# Patient Record
Sex: Male | Born: 1960 | Race: Black or African American | Hispanic: No | Marital: Married | Smoking: Light tobacco smoker
Health system: Southern US, Community
[De-identification: ages and names within clinical notes are randomized; demographics above are authoritative.]

## PROBLEM LIST (undated history)

## (undated) DIAGNOSIS — C61 Malignant neoplasm of prostate: Secondary | ICD-10-CM

## (undated) HISTORY — PX: PROSTATE BIOPSY: SHX241

---

## 2010-01-02 ENCOUNTER — Encounter: Admission: RE | Admit: 2010-01-02 | Discharge: 2010-01-02 | Payer: Self-pay | Admitting: Family Medicine

## 2010-07-22 ENCOUNTER — Encounter: Payer: Self-pay | Admitting: Family Medicine

## 2012-06-16 ENCOUNTER — Encounter: Payer: Self-pay | Admitting: Internal Medicine

## 2012-07-28 ENCOUNTER — Encounter: Payer: Self-pay | Admitting: Internal Medicine

## 2013-04-22 ENCOUNTER — Ambulatory Visit (INDEPENDENT_AMBULATORY_CARE_PROVIDER_SITE_OTHER): Payer: BC Managed Care – PPO

## 2013-04-22 ENCOUNTER — Ambulatory Visit: Payer: Self-pay | Admitting: Family Medicine

## 2013-04-22 ENCOUNTER — Encounter (INDEPENDENT_AMBULATORY_CARE_PROVIDER_SITE_OTHER): Payer: Self-pay

## 2013-04-22 ENCOUNTER — Ambulatory Visit (INDEPENDENT_AMBULATORY_CARE_PROVIDER_SITE_OTHER): Payer: BC Managed Care – PPO | Admitting: Family Medicine

## 2013-04-22 DIAGNOSIS — M25569 Pain in unspecified knee: Secondary | ICD-10-CM

## 2013-04-22 DIAGNOSIS — M25562 Pain in left knee: Secondary | ICD-10-CM

## 2013-04-22 MED ORDER — TRAMADOL-ACETAMINOPHEN 37.5-325 MG PO TABS: 1.0000 | ORAL_TABLET | Freq: Four times a day (QID) | ORAL | Status: DC | PRN

## 2013-04-22 NOTE — Progress Notes (Signed)
  Subjective:    Patient ID: Thomas Watson, male    DOB: 1961-03-30, 52 y.o.   MRN: [id number]  HPI Patient is in stable chief complaint knee pain status post fall. Patient was working in his  Yard last week when he actually fell landing on his left knee. Patient states he said persistent left knee pain since this point. No knee swelling, locking or giving away. S. Eppard on the anterior knee pain with some medial knee pain as well. No prior history of knee arthritis in the trauma in the past. Has been able to bear weight. Has not been using anything for pain.    Review of Systems  All other systems reviewed and are negative.       Objective:   Physical Exam  Constitutional: He appears well-developed and well-nourished.  HENT:  Head: Normocephalic and atraumatic.  Eyes: Conjunctivae are normal. Pupils are equal, round, and reactive to light.  Neck: Normal range of motion.  Cardiovascular: Normal rate and regular rhythm.   Pulmonary/Chest: Effort normal and breath sounds normal.  Abdominal: Soft.  Musculoskeletal:  No visible deformities on exam. Mild anterior knee pain with resisted knee extension. Positive painful patella compression. McMurray's negative  Neurological: He is alert.   [facility] reading (PRIMARY) by  Dr. Alvester Morin  Left knee x-ray preliminary negative for any fracture or dislocation.                                         Assessment & Plan:  Left knee pain - Plan: DG Knee 1-2 Views Left, traMADol-acetaminophen (ULTRACET) 37.5-325 MG per tablet  Suspect traumatic knee sprain.  RICE and ultracet (avoid NSIADs given prior GI history).  Discussed general care and MSK red flags.  Follow up with sports medicine if sxs not improved.

## 2013-05-06 ENCOUNTER — Encounter: Payer: Self-pay | Admitting: *Deleted

## 2014-05-27 IMAGING — CR DG KNEE 1-2V*L*
2 series · 2 of 2 positions shown · non-contrast
Comparison: None.

CLINICAL DATA: Left knee pain.

EXAM:
LEFT KNEE - 1-2 VIEW

[view not recorded (1 of 2)]
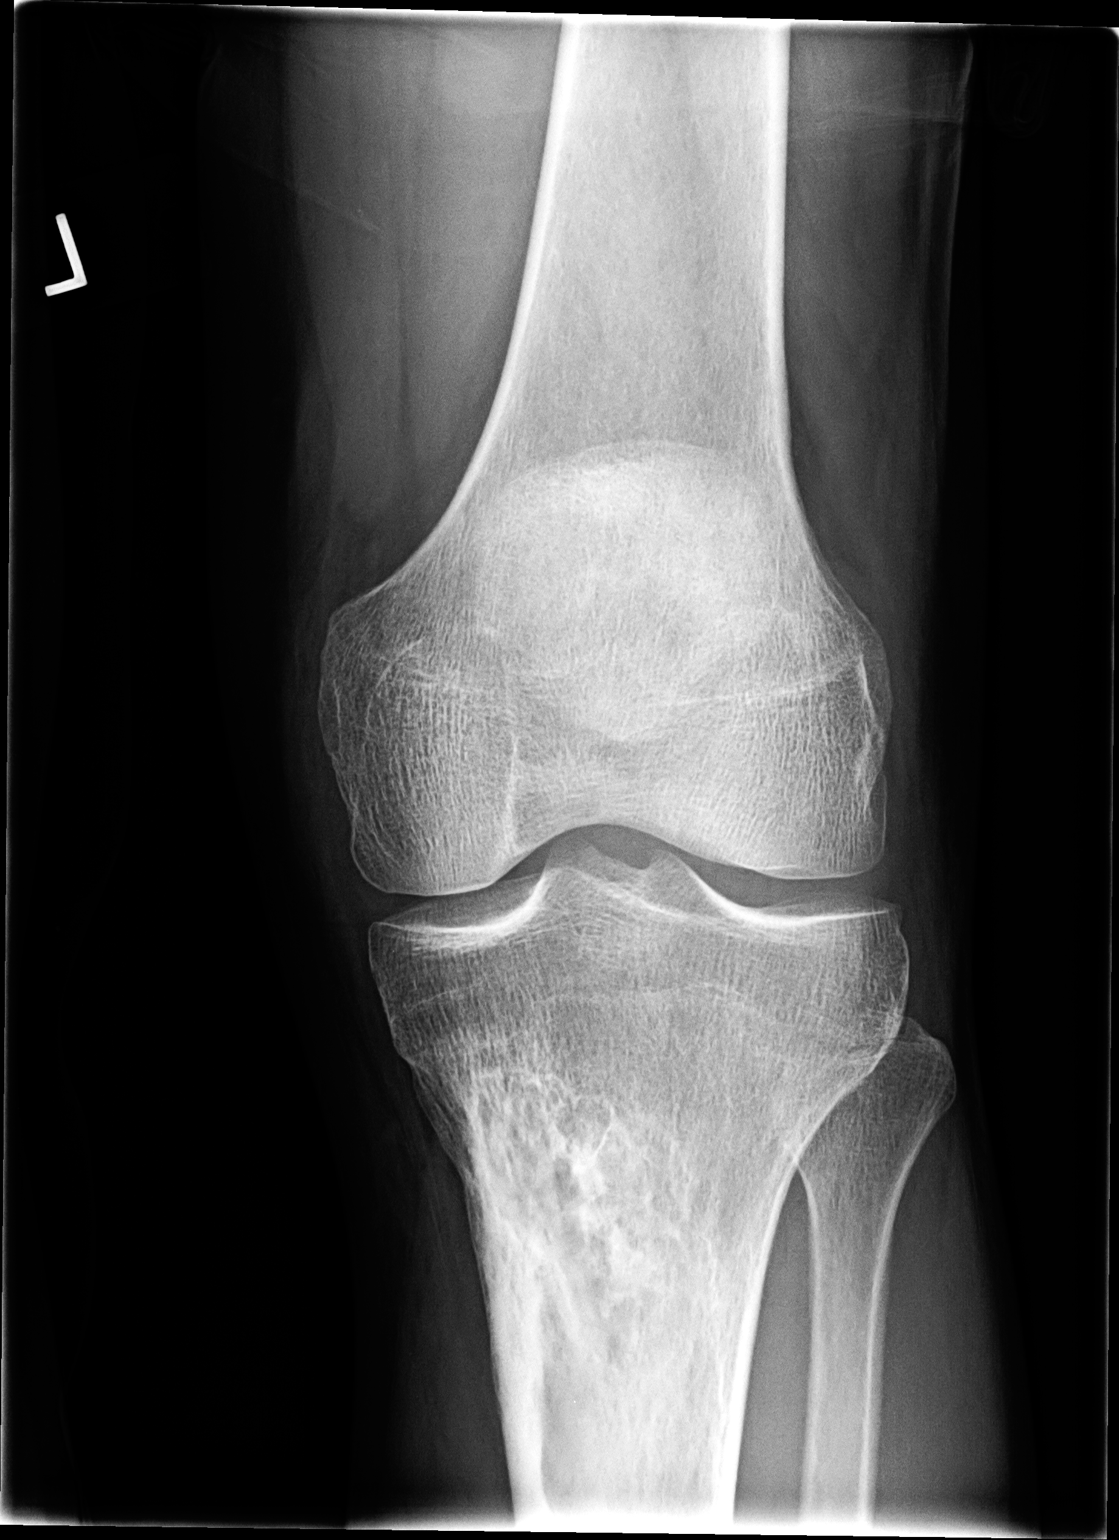

[view not recorded (2 of 2)]
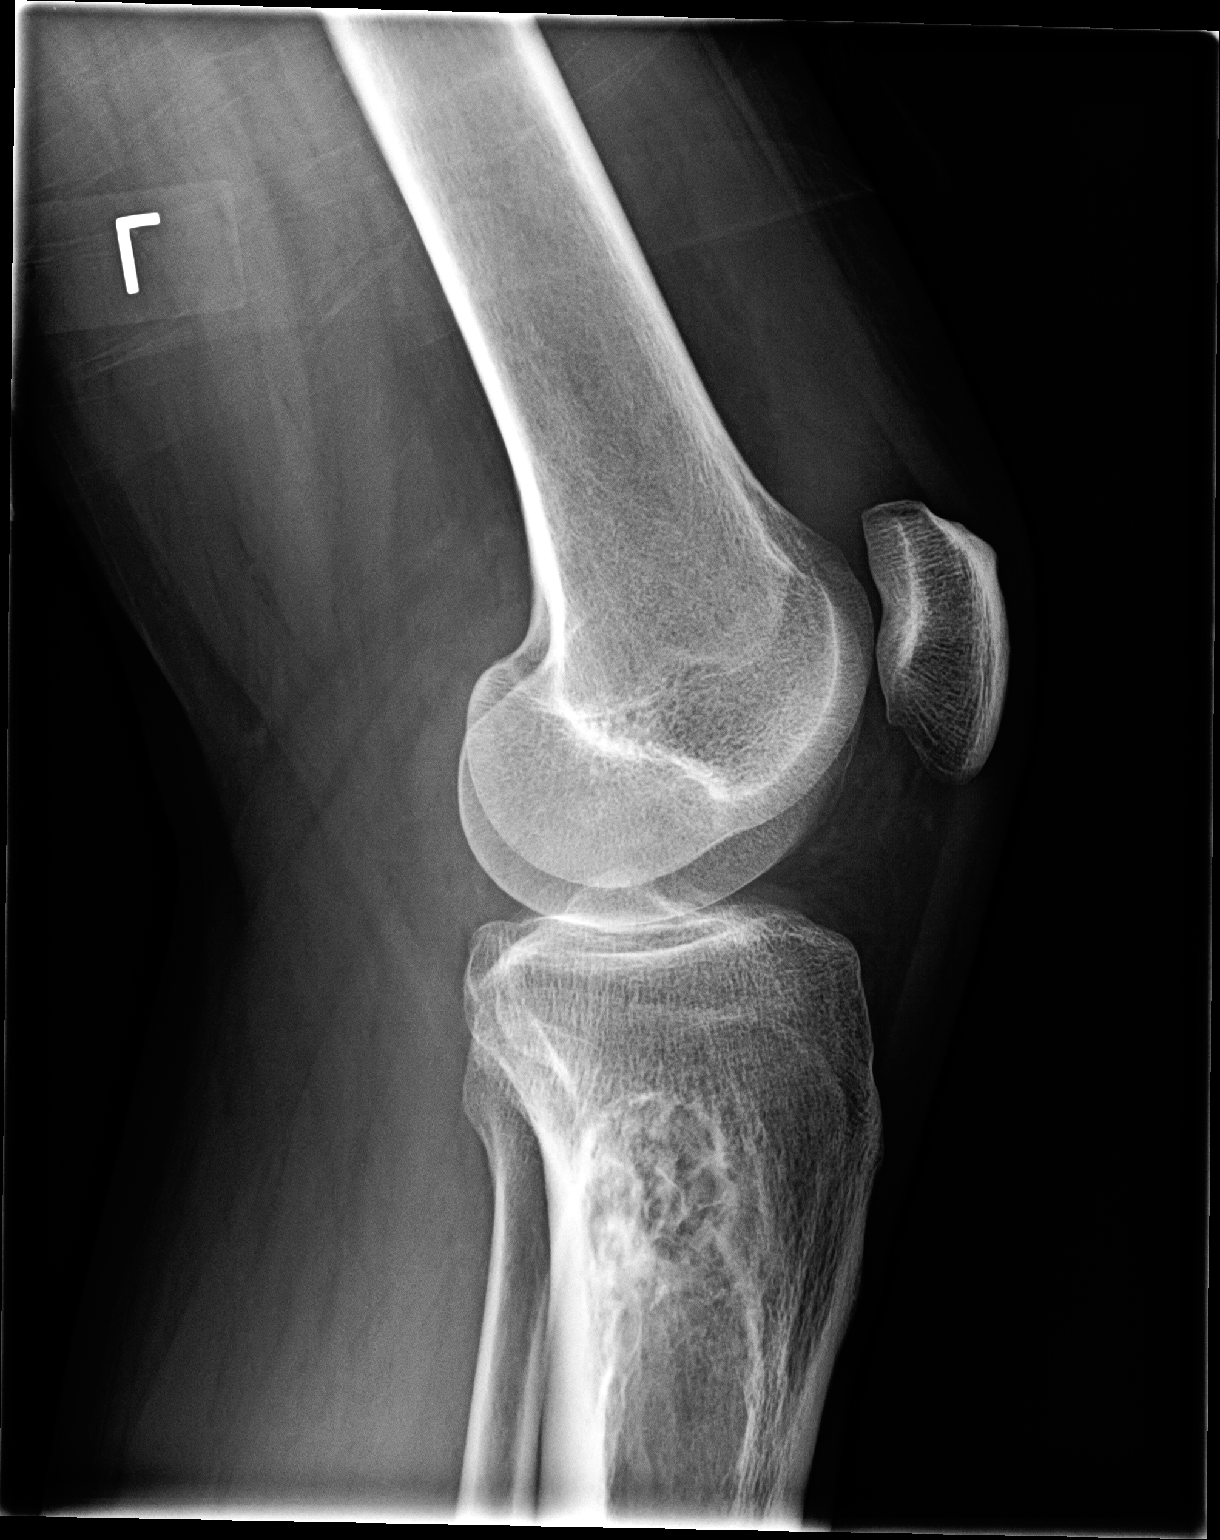

[2 of 2 positions shown; findings below may reference images not displayed]

FINDINGS: The joint spaces are maintained. No acute fracture or osteochondral
lesion. There is a moderate-sized joint effusion. There is a bone
lesion involving the proximal tibia with cortical thickening and
benign-appearing plain films features. Fibrous dysplasia and Paget's
disease are considerations. No worrisome plain film findings.
IMPRESSION: No acute bony findings.

Moderate-sized joint effusion.

Benign-appearing/nonaggressive bone lesion in the metadiaphyseal
region of the tibia. This is likely a benign process such as fibrous
dysplasia or Paget's disease. Follow up tib/fib x-rays in 4-6 months
recommended to document stability.a

## 2015-08-11 ENCOUNTER — Ambulatory Visit (INDEPENDENT_AMBULATORY_CARE_PROVIDER_SITE_OTHER): Payer: Managed Care, Other (non HMO) | Admitting: Family Medicine

## 2015-08-11 ENCOUNTER — Encounter: Payer: Self-pay | Admitting: Family Medicine

## 2015-08-11 VITALS — BP 140/78 | HR 74 | Temp 97.0°F | Ht 68.0 in | Wt 139.4 lb

## 2015-08-11 DIAGNOSIS — Z23 Encounter for immunization: Secondary | ICD-10-CM | POA: Diagnosis not present

## 2015-08-11 DIAGNOSIS — Z0000 Encounter for general adult medical examination without abnormal findings: Secondary | ICD-10-CM

## 2015-08-11 DIAGNOSIS — Z720 Tobacco use: Secondary | ICD-10-CM

## 2015-08-11 DIAGNOSIS — N400 Enlarged prostate without lower urinary tract symptoms: Secondary | ICD-10-CM

## 2015-08-11 DIAGNOSIS — N529 Male erectile dysfunction, unspecified: Secondary | ICD-10-CM

## 2015-08-11 MED ORDER — SILDENAFIL CITRATE 20 MG PO TABS: ORAL_TABLET | ORAL | Status: DC

## 2015-08-11 NOTE — Progress Notes (Signed)
   HPI  Patient presents today here for an annual physical.  Patient denies any complaints, has no concerns. 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  He denies any chest pain, dyspnea, palpitations, leg edema.  He is approximately one half pack per day smoker for many years. He's not really considering quitting.  He does not check his blood pressure, no history of hypertension. He does have a history of BPH, he tried Flomax but then had problems with ejaculation so he  changed to saw palmetto  He denies any urinary complaints currently.  His wife goes to [facility] gastroenterology, he would like to be referred there for colonoscopy.  He also mentions difficulty maintaining erection, he would like to try sildenafil  PMH: Smoking status noted ROS: Per HPI  Objective: BP 140/78 mmHg  Pulse 74  Temp(Src) 97 F (36.1 C) (Oral)  Ht _0  (1.727 m)  Wt 139 lb 6.4 oz (63.231 kg)  BMI 21.20 kg/m2 Gen: NAD, alert, cooperative with exam HEENT: NCAT, TMs normal bilaterally, nares clear, oropharynx clear CV: RRR, good S1/S2, no murmur Resp: CTABL, no wheezes, non-labored Abd: SNTND, BS present, no guarding or organomegaly Ext: No edema, warm Neuro: Alert and oriented, strength 5/5 and sensation intact in bilateral upper and lower extremities  Assessment and plan:  # Annual physical exam Normal exam Labs, practically fasting, he has had a boiled egg today.  # BPH Continue saw palmetto, PSA  # Tobacco abuse Counseled, is not really ready  # Healthcare maintenance Hep C Refer to GI for colonoscopy Labs Flu shot  Erectile dysf Trial of revatio  Borderline blood pressure-discussed usual range, he will monitor a local drugstore and return if persistently elevated over 140    Orders Placed This Encounter  Procedures  . CMP14+EGFR   . CBC  . Lipid panel  . PSA  . TSH  . Hepatitis C Antibody  . Ambulatory referral to Gastroenterology    Referral Priority:  Routine    Referral Type:  Consultation    Referral Reason:  Specialty Services Required    Number of Visits Requested:  1    Meds ordered this encounter  Medications  . sildenafil (REVATIO) 20 MG tablet    Sig: 2-5 pills once daily as needed for erectile dysfunction    Dispense:  30 tablet    Refill:  [place], MD [facility] 08/11/2015, 11:42 AM

## 2015-08-11 NOTE — Patient Instructions (Signed)
Great to meet you!  We will call within 1 week with your labs  You will have to by sildinafil without insurance coverage  You will get a call arranging the appt with [facility].   Lets see you back next year about this time.

## 2015-08-12 LAB — CMP14+EGFR
ALBUMIN: 5.1 g/dL (ref 3.5–5.5)
ALK PHOS: 73 IU/L (ref 39–117)
ALT: 40 IU/L (ref 0–44)
AST: 33 IU/L (ref 0–40)
Albumin/Globulin Ratio: 1.8 (ref 1.1–2.5)
BILIRUBIN TOTAL: 0.4 mg/dL (ref 0.0–1.2)
BUN / CREAT RATIO: 19 (ref 9–20)
BUN: 19 mg/dL (ref 6–24)
CHLORIDE: 102 mmol/L (ref 96–106)
CO2: 21 mmol/L (ref 18–29)
CREATININE: 0.98 mg/dL (ref 0.76–1.27)
Calcium: 10.5 mg/dL — ABNORMAL HIGH (ref 8.7–10.2)
GFR calc Af Amer: 101 mL/min/{1.73_m2} (ref >59)
GFR calc non Af Amer: 87 mL/min/{1.73_m2} (ref >59)
GLUCOSE: 91 mg/dL (ref 65–99)
Globulin, Total: 2.8 g/dL (ref 1.5–4.5)
Potassium: 5.4 mmol/L — ABNORMAL HIGH (ref 3.5–5.2)
SODIUM: 148 mmol/L — AB (ref 134–144)
Total Protein: 7.9 g/dL (ref 6.0–8.5)

## 2015-08-12 LAB — CBC
HEMATOCRIT: 46.1 % (ref 37.5–51.0)
Hemoglobin: 15.1 g/dL (ref 12.6–17.7)
MCH: 29.9 pg (ref 26.6–33.0)
MCHC: 32.8 g/dL (ref 31.5–35.7)
MCV: 91 fL (ref 79–97)
PLATELETS: 313 10*3/uL (ref 150–379)
RBC: 5.05 x10E6/uL (ref 4.14–5.80)
RDW: 14.9 % (ref 12.3–15.4)
WBC: 6.3 10*3/uL (ref 3.4–10.8)

## 2015-08-12 LAB — LIPID PANEL
Chol/HDL Ratio: 2.4 ratio units (ref 0.0–5.0)
Cholesterol, Total: 208 mg/dL — ABNORMAL HIGH (ref 100–199)
HDL: 85 mg/dL (ref >39)
LDL Calculated: 104 mg/dL — ABNORMAL HIGH (ref 0–99)
TRIGLYCERIDES: 96 mg/dL (ref 0–149)
VLDL CHOLESTEROL CAL: 19 mg/dL (ref 5–40)

## 2015-08-12 LAB — HEPATITIS C ANTIBODY: Hep C Virus Ab: <0.1 s/co ratio (ref 0.0–0.9)

## 2015-08-12 LAB — TSH: TSH: 0.53 u[IU]/mL (ref 0.450–4.500)

## 2015-08-12 LAB — PSA: Prostate Specific Ag, Serum: 3.2 ng/mL (ref 0.0–4.0)

## 2015-08-14 ENCOUNTER — Telehealth: Payer: Self-pay | Admitting: Family Medicine

## 2015-08-25 ENCOUNTER — Other Ambulatory Visit (INDEPENDENT_AMBULATORY_CARE_PROVIDER_SITE_OTHER): Payer: Managed Care, Other (non HMO)

## 2015-08-25 DIAGNOSIS — R799 Abnormal finding of blood chemistry, unspecified: Secondary | ICD-10-CM

## 2015-08-25 NOTE — Progress Notes (Signed)
Lab only 

## 2015-08-26 LAB — CMP14+EGFR
A/G RATIO: 2 (ref 1.1–2.5)
ALK PHOS: 61 IU/L (ref 39–117)
ALT: 33 IU/L (ref 0–44)
AST: 29 IU/L (ref 0–40)
Albumin: 4.6 g/dL (ref 3.5–5.5)
BUN/Creatinine Ratio: 18 (ref 9–20)
BUN: 17 mg/dL (ref 6–24)
Bilirubin Total: 0.3 mg/dL (ref 0.0–1.2)
CO2: 22 mmol/L (ref 18–29)
Calcium: 10.1 mg/dL (ref 8.7–10.2)
Chloride: 101 mmol/L (ref 96–106)
Creatinine, Ser: 0.92 mg/dL (ref 0.76–1.27)
GFR calc Af Amer: 109 mL/min/{1.73_m2} (ref >59)
GFR calc non Af Amer: 94 mL/min/{1.73_m2} (ref >59)
GLOBULIN, TOTAL: 2.3 g/dL (ref 1.5–4.5)
Glucose: 98 mg/dL (ref 65–99)
POTASSIUM: 4.9 mmol/L (ref 3.5–5.2)
SODIUM: 142 mmol/L (ref 134–144)
Total Protein: 6.9 g/dL (ref 6.0–8.5)

## 2017-01-03 ENCOUNTER — Ambulatory Visit: Payer: Managed Care, Other (non HMO) | Admitting: Family Medicine

## 2017-01-21 DIAGNOSIS — Z86010 Personal history of colonic polyps: Secondary | ICD-10-CM | POA: Insufficient documentation

## 2017-01-22 ENCOUNTER — Encounter: Payer: Self-pay | Admitting: Family Medicine

## 2017-01-22 DIAGNOSIS — D126 Benign neoplasm of colon, unspecified: Secondary | ICD-10-CM | POA: Insufficient documentation

## 2017-11-11 ENCOUNTER — Encounter: Payer: Managed Care, Other (non HMO) | Admitting: Family Medicine

## 2018-02-17 ENCOUNTER — Encounter: Payer: Self-pay | Admitting: Family Medicine

## 2018-02-17 ENCOUNTER — Ambulatory Visit (INDEPENDENT_AMBULATORY_CARE_PROVIDER_SITE_OTHER): Payer: 59 | Admitting: Family Medicine

## 2018-02-17 VITALS — BP 114/75 | HR 85 | Temp 98.1°F | Ht 68.0 in | Wt 146.5 lb

## 2018-02-17 DIAGNOSIS — Z0000 Encounter for general adult medical examination without abnormal findings: Secondary | ICD-10-CM | POA: Diagnosis not present

## 2018-02-17 DIAGNOSIS — Z122 Encounter for screening for malignant neoplasm of respiratory organs: Secondary | ICD-10-CM

## 2018-02-17 MED ORDER — SILDENAFIL CITRATE 20 MG PO TABS
ORAL_TABLET | ORAL | 12 refills | Status: DC
Start: 2018-02-17 — End: 2019-04-29

## 2018-02-17 MED ORDER — SILDENAFIL CITRATE 20 MG PO TABS: ORAL_TABLET | ORAL | 12 refills | Status: DC

## 2018-02-17 NOTE — Patient Instructions (Signed)
See [facility] in [place].    Stop Smoking

## 2018-02-17 NOTE — Progress Notes (Signed)
Subjective:  Patient ID: Thomas Watson, male    DOB: 10-15-1960  Age: 57 y.o. MRN: [id number]  CC: Annual Exam (pt here today for CPE)   HPI Thomas Watson presents for Complete Physical  Depression screen [facility] 2/9 02/17/2018 08/11/2015  Decreased Interest 0 0  Down, Depressed, Hopeless 0 0  PHQ - 2 Score 0 0    History Thomas Watson has no past medical history on file.   He has no past surgical history on file.   His family history is not on file.He reports that he has been smoking cigarettes. He has been smoking about 0.25 packs per day. He has never used smokeless tobacco. He reports that he drinks alcohol. He reports that he does not use drugs.    ROS Review of Systems  Constitutional: Negative.   HENT: Negative.   Eyes: Negative for visual disturbance.  Respiratory: Negative for cough and shortness of breath.   Cardiovascular: Negative for chest pain and leg swelling.  Gastrointestinal: Negative for abdominal pain, diarrhea, nausea and vomiting.  Genitourinary: Negative for difficulty urinating.  Musculoskeletal: Negative for arthralgias and myalgias.  Skin: Negative for rash.  Neurological: Negative for headaches.  Psychiatric/Behavioral: Negative for sleep disturbance.    Objective:  BP 114/75   Pulse 85   Temp 98.1 F (36.7 C) (Oral)   Ht 5' 8" (1.727 m)   Wt 146 lb 8 oz (66.5 kg)   BMI 22.28 kg/m   BP Readings from Last 3 Encounters:  02/17/18 114/75  08/11/15 140/78    Wt Readings from Last 3 Encounters:  02/17/18 146 lb 8 oz (66.5 kg)  08/11/15 139 lb 6.4 oz (63.2 kg)     Physical Exam  Constitutional: He is oriented to person, place, and time. He appears well-developed and well-nourished.  HENT:  Head: Normocephalic and atraumatic.  Mouth/Throat: Oropharynx is clear and moist.  Eyes: Pupils are equal, round, and reactive to light. EOM are normal.  Neck: Normal range of motion. No tracheal deviation present. No thyromegaly present.    Cardiovascular: Normal rate, regular rhythm and normal heart sounds. Exam reveals no gallop and no friction rub.  No murmur heard. Pulmonary/Chest: Breath sounds normal. He has no wheezes. He has no rales.  Abdominal: Soft. Bowel sounds are normal. He exhibits no distension and no mass. There is no tenderness. Hernia confirmed negative in the right inguinal area and confirmed negative in the left inguinal area.  Genitourinary: Testes normal and penis normal.  Musculoskeletal: Normal range of motion. He exhibits no edema.  Lymphadenopathy:    He has no cervical adenopathy.  Neurological: He is alert and oriented to person, place, and time.  Skin: Skin is warm and dry.  Psychiatric: He has a normal mood and affect.      Assessment & Plan:   Thomas Watson was seen today for annual exam.  Diagnoses and all orders for this visit:  Well adult exam -     CBC with Differential/Platelet -     CMP14+EGFR -     Lipid panel -     Urinalysis -     PSA Total (Reflex To Free) -     VITAMIN D 25 Hydroxy (Vit-D Deficiency, Fractures)  Encounter for screening for malignant neoplasm of respiratory organs -     CT CHEST LUNG CA SCREEN LOW DOSE W/O CM; Future  Other orders -     Discontinue: sildenafil (REVATIO) 20 MG tablet; 2-5 pills once daily as needed for erectile dysfunction -  sildenafil (REVATIO) 20 MG tablet; 2-5 pills once daily as needed for erectile dysfunction       I am having Thomas Watson maintain his sildenafil.  Allergies as of 02/17/2018   No Known Allergies     Medication List        Accurate as of 02/17/18 10:55 PM. Always use your most recent med list.          sildenafil 20 MG tablet Commonly known as:  REVATIO 2-5 pills once daily as needed for erectile dysfunction        Follow-up: Return in about 1 year (around 02/18/2019).  Warren Stacks, M.D. 

## 2018-02-18 ENCOUNTER — Other Ambulatory Visit: Payer: Self-pay

## 2018-02-18 DIAGNOSIS — R972 Elevated prostate specific antigen [PSA]: Secondary | ICD-10-CM

## 2018-02-18 LAB — CBC WITH DIFFERENTIAL/PLATELET
BASOS ABS: 0 10*3/uL (ref 0.0–0.2)
BASOS: 0 %
EOS (ABSOLUTE): 0.2 10*3/uL (ref 0.0–0.4)
EOS: 3 %
HEMATOCRIT: 44.7 % (ref 37.5–51.0)
HEMOGLOBIN: 14.7 g/dL (ref 13.0–17.7)
IMMATURE GRANS (ABS): 0 10*3/uL (ref 0.0–0.1)
Immature Granulocytes: 0 %
Lymphocytes Absolute: 2.6 10*3/uL (ref 0.7–3.1)
Lymphs: 34 %
MCH: 29.3 pg (ref 26.6–33.0)
MCHC: 32.9 g/dL (ref 31.5–35.7)
MCV: 89 fL (ref 79–97)
MONOCYTES: 7 %
Monocytes Absolute: 0.5 10*3/uL (ref 0.1–0.9)
NEUTROS ABS: 4.2 10*3/uL (ref 1.4–7.0)
Neutrophils: 56 %
Platelets: 309 10*3/uL (ref 150–450)
RBC: 5.01 x10E6/uL (ref 4.14–5.80)
RDW: 14.1 % (ref 12.3–15.4)
WBC: 7.5 10*3/uL (ref 3.4–10.8)

## 2018-02-18 LAB — VITAMIN D 25 HYDROXY (VIT D DEFICIENCY, FRACTURES): Vit D, 25-Hydroxy: 61.4 ng/mL (ref 30.0–100.0)

## 2018-02-18 LAB — LIPID PANEL
Chol/HDL Ratio: 2.9 ratio (ref 0.0–5.0)
Cholesterol, Total: 187 mg/dL (ref 100–199)
HDL: 64 mg/dL (ref >39)
LDL CALC: 104 mg/dL — AB (ref 0–99)
Triglycerides: 94 mg/dL (ref 0–149)
VLDL Cholesterol Cal: 19 mg/dL (ref 5–40)

## 2018-02-18 LAB — CMP14+EGFR
ALBUMIN: 4.7 g/dL (ref 3.5–5.5)
ALT: 20 IU/L (ref 0–44)
AST: 20 IU/L (ref 0–40)
Albumin/Globulin Ratio: 2 (ref 1.2–2.2)
Alkaline Phosphatase: 75 IU/L (ref 39–117)
BILIRUBIN TOTAL: 0.2 mg/dL (ref 0.0–1.2)
BUN / CREAT RATIO: 21 — AB (ref 9–20)
BUN: 19 mg/dL (ref 6–24)
CHLORIDE: 101 mmol/L (ref 96–106)
CO2: 21 mmol/L (ref 20–29)
Calcium: 10.2 mg/dL (ref 8.7–10.2)
Creatinine, Ser: 0.92 mg/dL (ref 0.76–1.27)
GFR calc non Af Amer: 92 mL/min/{1.73_m2} (ref >59)
GFR, EST AFRICAN AMERICAN: 106 mL/min/{1.73_m2} (ref >59)
GLUCOSE: 83 mg/dL (ref 65–99)
Globulin, Total: 2.4 g/dL (ref 1.5–4.5)
Potassium: 4.8 mmol/L (ref 3.5–5.2)
Sodium: 141 mmol/L (ref 134–144)
TOTAL PROTEIN: 7.1 g/dL (ref 6.0–8.5)

## 2018-02-18 LAB — FPSA% REFLEX
% FREE PSA: 10.7 %
PSA, FREE: 0.49 ng/mL

## 2018-02-18 LAB — PSA TOTAL (REFLEX TO FREE): PROSTATE SPECIFIC AG, SERUM: 4.6 ng/mL — AB (ref 0.0–4.0)

## 2018-02-19 ENCOUNTER — Telehealth: Payer: Self-pay | Admitting: Family Medicine

## 2018-02-19 DIAGNOSIS — L729 Follicular cyst of the skin and subcutaneous tissue, unspecified: Secondary | ICD-10-CM

## 2018-02-19 NOTE — Telephone Encounter (Signed)
Please refer to plastic surgery

## 2018-03-03 NOTE — Telephone Encounter (Signed)
No call back- referral has been placed.

## 2018-03-12 ENCOUNTER — Ambulatory Visit (HOSPITAL_COMMUNITY): Payer: Self-pay

## 2018-11-13 ENCOUNTER — Telehealth: Payer: Self-pay | Admitting: Family Medicine

## 2019-02-12 ENCOUNTER — Telehealth: Payer: Self-pay | Admitting: Family Medicine

## 2019-02-12 NOTE — Telephone Encounter (Signed)
appt rescheduled.  Patient aware

## 2019-02-19 ENCOUNTER — Encounter: Payer: 59 | Admitting: Family Medicine

## 2019-03-16 ENCOUNTER — Other Ambulatory Visit: Payer: Self-pay

## 2019-03-17 ENCOUNTER — Encounter: Payer: 59 | Admitting: Family Medicine

## 2019-03-30 ENCOUNTER — Encounter: Payer: 59 | Admitting: Family Medicine

## 2019-04-29 ENCOUNTER — Encounter: Payer: Self-pay | Admitting: Family Medicine

## 2019-04-29 ENCOUNTER — Ambulatory Visit (INDEPENDENT_AMBULATORY_CARE_PROVIDER_SITE_OTHER): Payer: 59 | Admitting: Family Medicine

## 2019-04-29 ENCOUNTER — Other Ambulatory Visit: Payer: Self-pay

## 2019-04-29 VITALS — BP 140/93 | HR 86 | Temp 97.3°F | Ht 68.0 in | Wt 145.2 lb

## 2019-04-29 DIAGNOSIS — D1721 Benign lipomatous neoplasm of skin and subcutaneous tissue of right arm: Secondary | ICD-10-CM

## 2019-04-29 DIAGNOSIS — Z720 Tobacco use: Secondary | ICD-10-CM | POA: Diagnosis not present

## 2019-04-29 DIAGNOSIS — Z0001 Encounter for general adult medical examination with abnormal findings: Secondary | ICD-10-CM

## 2019-04-29 DIAGNOSIS — Z23 Encounter for immunization: Secondary | ICD-10-CM

## 2019-04-29 DIAGNOSIS — Z0000 Encounter for general adult medical examination without abnormal findings: Secondary | ICD-10-CM

## 2019-04-29 MED ORDER — SILDENAFIL CITRATE 20 MG PO TABS: ORAL_TABLET | ORAL | 12 refills | Status: DC

## 2019-04-29 NOTE — Progress Notes (Signed)
Subjective:  Patient ID: Thomas Watson, male    DOB: 06-Aug-1960  Age: 58 y.o. MRN: [id number]  CC: Annual Exam   HPI Kshawn Canal presents for Annual physical. No current symptoms or concerns. Smoker with about 7.5 pack-year hx.  Depression screen [facility] 2/9 04/29/2019 02/17/2018 08/11/2015  Decreased Interest 0 0 0  Down, Depressed, Hopeless 0 0 0  PHQ - 2 Score 0 0 0    History Keymari has no past medical history on file.   He has no past surgical history on file.   His family history is not on file.He reports that he has been smoking cigarettes. He has been smoking about 0.25 packs per day. He has never used smokeless tobacco. He reports current alcohol use. He reports that he does not use drugs.    ROS Review of Systems  Constitutional: Negative for activity change, fatigue and unexpected weight change.  HENT: Negative for congestion, ear pain, hearing loss, postnasal drip and trouble swallowing.   Eyes: Negative for pain and visual disturbance.  Respiratory: Negative for cough, chest tightness and shortness of breath.   Cardiovascular: Negative for chest pain, palpitations and leg swelling.  Gastrointestinal: Negative for abdominal distention, abdominal pain, blood in stool, constipation, diarrhea, nausea and vomiting.  Endocrine: Negative for cold intolerance, heat intolerance and polydipsia.  Genitourinary: Negative for difficulty urinating, dysuria, flank pain, frequency and urgency.  Musculoskeletal: Negative for arthralgias and joint swelling.  Skin: Negative for color change, rash and wound.  Neurological: Negative for dizziness, syncope, speech difficulty, weakness, light-headedness, numbness and headaches.  Hematological: Does not bruise/bleed easily.  Psychiatric/Behavioral: Negative for confusion, decreased concentration, dysphoric mood and sleep disturbance. The patient is not nervous/anxious.     Objective:  BP (!) 140/93   Pulse 86   Temp (!) 97.3 F  (36.3 C) (Temporal)   Ht 5' 8"  (1.727 m)   Wt 145 lb 3.2 oz (65.9 kg)   SpO2 99%   BMI 22.08 kg/m   BP Readings from Last 3 Encounters:  04/29/19 (!) 140/93  02/17/18 114/75  08/11/15 140/78    Wt Readings from Last 3 Encounters:  04/29/19 145 lb 3.2 oz (65.9 kg)  02/17/18 146 lb 8 oz (66.5 kg)  08/11/15 139 lb 6.4 oz (63.2 kg)     Physical Exam Constitutional:      Appearance: He is well-developed.  HENT:     Head: Normocephalic and atraumatic.  Eyes:     Pupils: Pupils are equal, round, and reactive to light.  Neck:     Musculoskeletal: Normal range of motion.     Thyroid: No thyromegaly.     Trachea: No tracheal deviation.  Cardiovascular:     Rate and Rhythm: Normal rate and regular rhythm.     Heart sounds: Normal heart sounds. No murmur. No friction rub. No gallop.   Pulmonary:     Breath sounds: Normal breath sounds. No wheezing or rales.  Abdominal:     General: Bowel sounds are normal. There is no distension.     Palpations: Abdomen is soft. There is no mass.     Tenderness: There is no abdominal tenderness.     Hernia: There is no hernia in the left inguinal area.  Genitourinary:    Penis: Normal.      Scrotum/Testes: Normal.  Musculoskeletal: Normal range of motion.  Lymphadenopathy:     Cervical: No cervical adenopathy.  Skin:    General: Skin is warm and dry.  Neurological:  Mental Status: He is alert and oriented to person, place, and time.       Assessment & Plan:   Efe was seen today for annual exam.  Diagnoses and all orders for this visit:  Well adult exam -     CBC with Differential/Platelet -     CMP14+EGFR -     PSA Total (Reflex To Free) -     Lipid panel -     Urinalysis -     VITAMIN D 25 Hydroxy (Vit-D Deficiency, Fractures)  Tobacco abuse -     CBC with Differential/Platelet -     CMP14+EGFR -     PSA Total (Reflex To Free) -     Lipid panel -     Urinalysis -     VITAMIN D 25 Hydroxy (Vit-D Deficiency,  Fractures)  Lipoma of right shoulder -     Ambulatory referral to Plastic Surgery  Need for immunization against influenza -     Flu Vaccine QUAD 36+ mos IM  Other orders -     sildenafil (REVATIO) 20 MG tablet; 2-5 pills once daily as needed for erectile dysfunction    Smoking cessation strategies reviewed. Consider Chantix.    I am having Divine Hansley maintain his sildenafil.  Allergies as of 04/29/2019   No Known Allergies     Medication List       Accurate as of April 29, 2019  3:02 PM. If you have any questions, ask your nurse or doctor.        sildenafil 20 MG tablet Commonly known as: Revatio 2-5 pills once daily as needed for erectile dysfunction        Follow-up: Return in about 1 year (around 04/28/2020).  [name], M.D.

## 2019-04-30 LAB — CBC WITH DIFFERENTIAL/PLATELET
Basophils Absolute: 0.1 10*3/uL (ref 0.0–0.2)
Basos: 1 %
EOS (ABSOLUTE): 0.2 10*3/uL (ref 0.0–0.4)
Eos: 3 %
Hematocrit: 49.5 % (ref 37.5–51.0)
Hemoglobin: 15.8 g/dL (ref 13.0–17.7)
Immature Grans (Abs): 0 10*3/uL (ref 0.0–0.1)
Immature Granulocytes: 0 %
Lymphocytes Absolute: 2.6 10*3/uL (ref 0.7–3.1)
Lymphs: 41 %
MCH: 29.8 pg (ref 26.6–33.0)
MCHC: 31.9 g/dL (ref 31.5–35.7)
MCV: 93 fL (ref 79–97)
Monocytes Absolute: 0.6 10*3/uL (ref 0.1–0.9)
Monocytes: 9 %
Neutrophils Absolute: 3 10*3/uL (ref 1.4–7.0)
Neutrophils: 46 %
Platelets: 304 10*3/uL (ref 150–450)
RBC: 5.3 x10E6/uL (ref 4.14–5.80)
RDW: 13.5 % (ref 11.6–15.4)
WBC: 6.4 10*3/uL (ref 3.4–10.8)

## 2019-04-30 LAB — FPSA% REFLEX
% FREE PSA: 8.6 %
PSA, FREE: 0.49 ng/mL

## 2019-04-30 LAB — CMP14+EGFR
ALT: 28 IU/L (ref 0–44)
AST: 24 IU/L (ref 0–40)
Albumin/Globulin Ratio: 2.2 (ref 1.2–2.2)
Albumin: 5 g/dL — ABNORMAL HIGH (ref 3.8–4.9)
Alkaline Phosphatase: 71 IU/L (ref 39–117)
BUN/Creatinine Ratio: 23 — ABNORMAL HIGH (ref 9–20)
BUN: 19 mg/dL (ref 6–24)
Bilirubin Total: 0.4 mg/dL (ref 0.0–1.2)
CO2: 25 mmol/L (ref 20–29)
Calcium: 9.9 mg/dL (ref 8.7–10.2)
Chloride: 102 mmol/L (ref 96–106)
Creatinine, Ser: 0.83 mg/dL (ref 0.76–1.27)
GFR calc Af Amer: 112 mL/min/{1.73_m2} (ref >59)
GFR calc non Af Amer: 97 mL/min/{1.73_m2} (ref >59)
Globulin, Total: 2.3 g/dL (ref 1.5–4.5)
Glucose: 83 mg/dL (ref 65–99)
Potassium: 4.6 mmol/L (ref 3.5–5.2)
Sodium: 139 mmol/L (ref 134–144)
Total Protein: 7.3 g/dL (ref 6.0–8.5)

## 2019-04-30 LAB — LIPID PANEL
Chol/HDL Ratio: 2.4 ratio (ref 0.0–5.0)
Cholesterol, Total: 186 mg/dL (ref 100–199)
HDL: 76 mg/dL (ref >39)
LDL Chol Calc (NIH): 94 mg/dL (ref 0–99)
Triglycerides: 87 mg/dL (ref 0–149)
VLDL Cholesterol Cal: 16 mg/dL (ref 5–40)

## 2019-04-30 LAB — PSA TOTAL (REFLEX TO FREE): Prostate Specific Ag, Serum: 5.7 ng/mL — ABNORMAL HIGH (ref 0.0–4.0)

## 2019-04-30 LAB — VITAMIN D 25 HYDROXY (VIT D DEFICIENCY, FRACTURES): Vit D, 25-Hydroxy: 49.8 ng/mL (ref 30.0–100.0)

## 2019-05-03 ENCOUNTER — Other Ambulatory Visit: Payer: Self-pay | Admitting: Family Medicine

## 2019-05-03 DIAGNOSIS — R972 Elevated prostate specific antigen [PSA]: Secondary | ICD-10-CM

## 2019-07-09 ENCOUNTER — Institutional Professional Consult (permissible substitution): Payer: Self-pay | Admitting: Plastic Surgery

## 2019-08-27 ENCOUNTER — Institutional Professional Consult (permissible substitution): Payer: Self-pay | Admitting: Plastic Surgery

## 2019-09-16 ENCOUNTER — Encounter: Payer: Self-pay | Admitting: Radiation Oncology

## 2019-09-16 NOTE — Progress Notes (Signed)
GU Location of Tumor / Histology: prostatic adenocarcinoma  If Prostate Cancer, Gleason Score is (4 + 3) and PSA is (6.11)in January 2021. Prostate volume: 27.39 grams. 7 of 12 cores involved.  Thomas Watson was referred to Dr. Karsten Ro by his PCP, Dr. [name], for further evaluation of an elevated PSA of 5.7 in October 04/29/19.   Biopsies of prostate (if applicable) revealed:    Past/Anticipated interventions by urology, if any: prostate biopsy, CT scan, not interested in surgery, referred to Dr. Tammi Klippel to weight external beam vs seeding  Past/Anticipated interventions by medical oncology, if any: no  Weight changes, if any: denies  Bowel/Bladder complaints, if any: IPSS 6. SHIM 25. Denies urinary leakage or incontinence. Denies dysuria or hematuria. Denies any bowel complaints. Denies erectile dysfunction but denies being active recently.    Nausea/Vomiting, if any: denies  Pain issues, if any:  denies  SAFETY ISSUES:  Prior radiation? denies  Pacemaker/ICD? denies  Possible current pregnancy? no, male patient  Is the patient on methotrexate? no  Current Complaints / other details:  59 years old. Married to Thomas Watson. Three children from a prior relationship. Works full time. States, "I feel fine if Dr. Livia Snellen hadn't told me something was wrong I would have never known."

## 2019-09-17 ENCOUNTER — Ambulatory Visit
Admission: RE | Admit: 2019-09-17 | Discharge: 2019-09-17 | Disposition: A | Discharge status: Home / Self Care | Payer: 59 | Source: Ambulatory Visit | Attending: Radiation Oncology | Admitting: Radiation Oncology

## 2019-09-17 ENCOUNTER — Other Ambulatory Visit: Payer: Self-pay

## 2019-09-17 ENCOUNTER — Encounter: Payer: Self-pay | Admitting: Urology

## 2019-09-17 ENCOUNTER — Encounter: Payer: Self-pay | Admitting: Radiation Oncology

## 2019-09-17 VITALS — Ht 68.0 in | Wt 145.0 lb

## 2019-09-17 DIAGNOSIS — C61 Malignant neoplasm of prostate: Secondary | ICD-10-CM

## 2019-09-17 HISTORY — DX: Malignant neoplasm of prostate: C61

## 2019-09-17 NOTE — Progress Notes (Signed)
See progress note under physician encounter. 

## 2019-09-17 NOTE — Progress Notes (Signed)
Radiation Oncology         [phone] ________________________________  Initial Outpatient Consultation - Conducted via Telephone due to current COVID-19 concerns for limiting patient exposure  Name: Thomas Watson MRN: [id number]  Date: 09/17/2019  DOB: [birth year]  OB:[id number], Cletus Gash, MD  [name], MD   REFERRING PHYSICIAN: [name], MD  DIAGNOSIS: 59 y.o. gentleman with Stage T1c adenocarcinoma of the prostate with Gleason score of 4+3, and PSA of 6.11.    ICD-10-CM   1. Malignant neoplasm of prostate [facility])  C61     HISTORY OF PRESENT ILLNESS: Thomas Watson is a 59 y.o. male with a diagnosis of prostate cancer. He was noted to have a borderline elevated PSA in 2019, per patient report, and this was found to be further elevated at 5.7 on 04/29/19 by his primary care physician, Dr. Livia Snellen.  Accordingly, he was referred for evaluation in urology by Dr. Karsten Ro on 07/06/2019,  digital rectal examination was performed at that time revealing slight right-sided firmness with no worrisome nodularity.  Repeat PSA performed at that time was 6.11.  Therefore, the patient proceeded to transrectal ultrasound with 12 biopsies of the prostate on 08/10/2019.  The prostate volume measured 27.39 cc.  Out of 12 core biopsies, 7 were positive.  The maximum Gleason score was 4+3, and this was seen in the right base lateral, right apex (with PNI), and right base (with PNI).  Additionally, Gleason 3+4 was seen in the right apex lateral (two small foci) and right mid lateral with a small foci of Gleason 3+3 in the right mid and left apex lateral.  He was scheduled to undergo CT abdomen/pelvis, but his insurance denied this.  The patient reviewed the biopsy results with his urologist and he has kindly been referred today for discussion of potential radiation treatment options.   PREVIOUS RADIATION THERAPY: No  PAST MEDICAL HISTORY:  Past Medical History:  Diagnosis Date  . Prostate cancer ([place])        PAST SURGICAL HISTORY: Past Surgical History:  Procedure Laterality Date  . PROSTATE BIOPSY      FAMILY HISTORY:  Family History  Problem Relation Age of Onset  . Colon cancer Paternal Uncle   . Bladder Cancer Paternal Grandmother   . Breast cancer Neg Hx   . Pancreatic cancer Neg Hx     SOCIAL HISTORY:  Social History   Socioeconomic History  . Marital status: Married    Spouse name: Caren Griffins   . Number of children: 3  . Years of education: Not on file  . Highest education level: Not on file  Occupational History    Comment: full time  Tobacco Use  . Smoking status: Light Tobacco Smoker    Packs/day: 0.25    Years: 30.00    Pack years: 7.50    Types: Cigarettes  . Smokeless tobacco: Never Used  Substance and Sexual Activity  . Alcohol use: Yes  . Drug use: No  . Sexual activity: Not Currently  Other Topics Concern  . Not on file  Social History Narrative   Patient and current wife don't have any children together. Patient has three children from a prior relationship.   Social Determinants of Health   Financial Resource Strain:   . Difficulty of Paying Living Expenses:   Food Insecurity:   . Worried About Charity fundraiser in the Last Year:   . Arboriculturist in the Last Year:   Transportation Needs:   . Lack  of Transportation (Medical):   Marland Kitchen Lack of Transportation (Non-Medical):   Physical Activity:   . Days of Exercise per Week:   . Minutes of Exercise per Session:   Stress:   . Feeling of Stress :   Social Connections:   . Frequency of Communication with Friends and Family:   . Frequency of Social Gatherings with Friends and Family:   . Attends Religious Services:   . Active Member of Clubs or Organizations:   . Attends Archivist Meetings:   Marland Kitchen Marital Status:   Intimate Partner Violence:   . Fear of Current or Ex-Partner:   . Emotionally Abused:   Marland Kitchen Physically Abused:   . Sexually Abused:     ALLERGIES:  Ibuprofen  MEDICATIONS:  Current Outpatient Medications  Medication Sig Dispense Refill  . cholecalciferol (VITAMIN D3) 25 MCG (1000 UNIT) tablet Take 1,000 Units by mouth daily.    . Multiple Vitamin (MULTIVITAMIN) tablet Take 1 tablet by mouth daily.    . Saw Palmetto 1000 MG CAPS Take by mouth.    . sildenafil (REVATIO) 20 MG tablet 2-5 pills once daily as needed for erectile dysfunction (Patient not taking: Reported on 09/17/2019) 30 tablet 12   No current facility-administered medications for this encounter.    REVIEW OF SYSTEMS:  On review of systems, the patient reports that he is doing well overall. He denies any chest pain, shortness of breath, cough, fevers, chills, night sweats, unintended weight changes. He denies any bowel disturbances, and denies abdominal pain, nausea or vomiting. He denies any new musculoskeletal or joint aches or pains. His IPSS was 6, indicating mild urinary symptoms. His SHIM was 25, indicating he does not have erectile dysfunction. A complete review of systems is obtained and is otherwise negative.    PHYSICAL EXAM:  Wt Readings from Last 3 Encounters:  09/17/19 145 lb (65.8 kg)  04/29/19 145 lb 3.2 oz (65.9 kg)  02/17/18 146 lb 8 oz (66.5 kg)   Temp Readings from Last 3 Encounters:  04/29/19 (!) 97.3 F (36.3 C) (Temporal)  02/17/18 98.1 F (36.7 C) (Oral)  08/11/15 97 F (36.1 C) (Oral)   BP Readings from Last 3 Encounters:  04/29/19 (!) 140/93  02/17/18 114/75  08/11/15 140/78   Pulse Readings from Last 3 Encounters:  04/29/19 86  02/17/18 85  08/11/15 74   Pain Assessment Pain Score: 0-No pain/10  Physical exam not performed in light of telephone consult visit format.   KPS = 100  100 - Normal; no complaints; no evidence of disease. 90   - Able to carry on normal activity; minor signs or symptoms of disease. 80   - Normal activity with effort; some signs or symptoms of disease. 57   - Cares for self; unable to carry on normal  activity or to do active work. 60   - Requires occasional assistance, but is able to care for most of his personal needs. 50   - Requires considerable assistance and frequent medical care. 43   - Disabled; requires special care and assistance. 43   - Severely disabled; hospital admission is indicated although death not imminent. 66   - Very sick; hospital admission necessary; active supportive treatment necessary. 10   - Moribund; fatal processes progressing rapidly. 0     - Dead  Karnofsky DA, Abelmann WH, Craver LS and Burchenal [facility] [phone]) The use of the nitrogen mustards in the palliative treatment of carcinoma: with particular reference to bronchogenic carcinoma Cancer  1 [id number]  LABORATORY DATA:  Lab Results  Component Value Date   WBC 6.4 04/29/2019   HGB 15.8 04/29/2019   HCT 49.5 04/29/2019   MCV 93 04/29/2019   PLT 304 04/29/2019   Lab Results  Component Value Date   NA 139 04/29/2019   K 4.6 04/29/2019   CL 102 04/29/2019   CO2 25 04/29/2019   Lab Results  Component Value Date   ALT 28 04/29/2019   AST 24 04/29/2019   ALKPHOS 71 04/29/2019   BILITOT 0.4 04/29/2019     RADIOGRAPHY: No results found.    IMPRESSION/PLAN: This visit was conducted via Telephone to spare the patient unnecessary potential exposure in the healthcare setting during the current COVID-19 pandemic. 1. 59 y.o. gentleman with Stage T1c adenocarcinoma of the prostate with Gleason Score of 4+3, and PSA of 6.11. We discussed the patient's workup and outlined the nature of prostate cancer in this setting. The patient's T stage, Gleason's score, and PSA put him into the unfavorable intermediate risk group. Accordingly, he is eligible for a variety of potential treatment options including brachytherapy, 5.5 weeks of external radiation, or prostatectomy. We discussed the available radiation techniques, and focused on the details and logistics of delivery. We discussed and outlined the risks, benefits, short  and long-term effects associated with radiotherapy and compared and contrasted these with prostatectomy. We discussed the role of SpaceOAR in reducing the rectal toxicity associated with radiotherapy.  At the end of the conversation, the patient is interested in moving forward with brachytherapy and use of SpaceOAR to reduce rectal toxicity from radiotherapy.  We will share our discussion with Dr. Karsten Ro and move forward with scheduling his CT [facility] planning appointment in the near future.  The patient will be contacted by Romie Jumper in our office who will be working closely with him to coordinate OR scheduling and pre and post procedure appointments.  We will contact the pharmaceutical rep to ensure that [place] is available at the time of procedure.  He will have a prostate MRI following his post-seed CT SIM to confirm appropriate distribution of the [place].   Given current concerns for patient exposure during the COVID-19 pandemic, this encounter was conducted via telephone. The patient was notified in advance and was offered a MyChart meeting to allow for face to face communication but unfortunately reported that he did not have the appropriate resources/technology to support such a visit and instead preferred to proceed with telephone consult. The patient has given verbal consent for this type of encounter. The time spent during this encounter was 50 minutes. The attendants for this meeting include Tyler Pita MD, Ashlyn Bruning PA-C, Bruce, and patient Thomas Watson. During the encounter, Tyler Pita MD, Ashlyn Bruning PA-C, and scribe, Wilburn Mylar were located at [facility].  Patient Thomas Watson was located at home.    Nicholos Johns, PA-C    Tyler Pita, MD  [facility] Direct Dial: [phone]  Fax: [phone] [email or web address]  Skype  [email or web address]  This document serves  as a record of services personally performed by Tyler Pita, MD and Freeman Caldron, PA-C. It was created on their behalf by Wilburn Mylar, a trained medical scribe. The creation of this record is based on the scribe's personal observations and the provider's statements to them. This document has been checked and approved by the attending provider.

## 2019-09-20 ENCOUNTER — Telehealth: Payer: Self-pay | Admitting: Medical Oncology

## 2019-09-20 ENCOUNTER — Ambulatory Visit: Payer: 59 | Attending: Internal Medicine

## 2019-09-20 DIAGNOSIS — Z23 Encounter for immunization: Secondary | ICD-10-CM

## 2019-09-20 NOTE — Telephone Encounter (Signed)
Spoke with patient to introduce myself as the prostate nurse navigator and discuss my role. I was unable to meet him 3/19, when he consulted with Dr. Tammi Klippel. He states the consult went well and he has chosen brachytherapy as treatment. He spoke with Enid Derry earlier today and is aware she will contact him with appointments. He had his first COVID vaccine today and will go for his second one 4/14. He asked if this will interfere with the procedure. I told him no. I gave him my contact information and asked him to call me with questions or concerns. He voiced understanding.

## 2019-09-20 NOTE — Progress Notes (Signed)
   Covid-19 Vaccination Clinic  Name:  Thomas Watson    MRN: [id number] DOB: 1960-12-17  09/20/2019  [name] was observed post Covid-19 immunization for 15 minutes without incident. He was provided with Vaccine Information Sheet and instruction to access the V-Safe system.   [name] was instructed to call 911 with any severe reactions post vaccine: Marland Kitchen Difficulty breathing  . Swelling of face and throat  . A fast heartbeat  . A bad rash all over body  . Dizziness and weakness   Immunizations Administered    Name Date Dose VIS Date Route   Pfizer COVID-19 Vaccine 09/20/2019 10:45 AM 0.3 mL 06/11/2019 Intramuscular   Manufacturer: [place]   Lot: [id number]   [place]: [id number]

## 2019-09-27 ENCOUNTER — Telehealth: Payer: Self-pay | Admitting: Medical Oncology

## 2019-09-27 ENCOUNTER — Encounter: Payer: Self-pay | Admitting: Radiation Oncology

## 2019-09-27 NOTE — Telephone Encounter (Signed)
Patient called asking if the appointments scheduled for 4/15 could be moved to 4/16. He states he has taken a lot of time off from work since being diagnosed with cancer. He is off on Friday and if he can get these rescheduled, he will not have to take more time off. I informed him, I will notify Enid Derry of his request and she will be in touch with him. He was very Patent attorney.

## 2019-09-28 ENCOUNTER — Telehealth: Payer: Self-pay | Admitting: Medical Oncology

## 2019-09-28 NOTE — Telephone Encounter (Signed)
Patient called stating he has not been called with appointment for CT. I explained the CT will done as planning for seed implant, and it will be done here at the cancer center on 4/15. I discussed what will take place during the CT simulation and where [employer] is located. He voiced understanding of the above and I asked him to call me if I can further assist him.

## 2019-10-08 ENCOUNTER — Ambulatory Visit: Payer: 59 | Attending: Internal Medicine

## 2019-10-08 DIAGNOSIS — Z23 Encounter for immunization: Secondary | ICD-10-CM

## 2019-10-08 NOTE — Progress Notes (Signed)
   Covid-19 Vaccination Clinic  Name:  Thomas Watson    MRN: [id number] DOB: 1961/04/29  10/08/2019  Mr. Thomas Watson was observed post Covid-19 immunization for 15 minutes without incident. He was provided with Vaccine Information Sheet and instruction to access the V-Safe system.   Mr. Thomas Watson was instructed to call 911 with any severe reactions post vaccine: Marland Kitchen Difficulty breathing  . Swelling of face and throat  . A fast heartbeat  . A bad rash all over body  . Dizziness and weakness   Immunizations Administered    Name Date Dose VIS Date Route   Pfizer COVID-19 Vaccine 10/08/2019 10:47 AM 0.3 mL 06/11/2019 Intramuscular   Manufacturer: [place]   Lot: [id number]   [place]: [id number]

## 2019-10-12 ENCOUNTER — Other Ambulatory Visit: Payer: Self-pay | Admitting: Urology

## 2019-10-12 ENCOUNTER — Telehealth: Payer: Self-pay | Admitting: *Deleted

## 2019-10-12 NOTE — Telephone Encounter (Signed)
Called patient to inform of implant date, spoke with patient and he is aware of this date. 

## 2019-10-13 ENCOUNTER — Ambulatory Visit: Payer: 59

## 2019-10-13 ENCOUNTER — Telehealth: Payer: Self-pay | Admitting: *Deleted

## 2019-10-13 NOTE — Telephone Encounter (Signed)
CALLED PATIENT TO REMIND OF PRE-SEED APPTS. FOR 10-14-19, SPOKE WITH PATIENT AND HE IS AWARE OF THESE APPTS.

## 2019-10-14 ENCOUNTER — Ambulatory Visit (HOSPITAL_COMMUNITY)
Admission: RE | Admit: 2019-10-14 | Discharge: 2019-10-14 | Disposition: A | Discharge status: Home / Self Care | Payer: 59 | Source: Ambulatory Visit | Attending: Urology | Admitting: Urology

## 2019-10-14 ENCOUNTER — Encounter (HOSPITAL_COMMUNITY)
Admission: RE | Admit: 2019-10-14 | Discharge: 2019-10-14 | Disposition: A | Discharge status: Home / Self Care | Payer: 59 | Source: Ambulatory Visit | Attending: Urology | Admitting: Urology

## 2019-10-14 ENCOUNTER — Other Ambulatory Visit: Payer: Self-pay

## 2019-10-14 ENCOUNTER — Encounter: Payer: Self-pay | Admitting: Medical Oncology

## 2019-10-14 ENCOUNTER — Ambulatory Visit
Admission: RE | Admit: 2019-10-14 | Discharge: 2019-10-14 | Disposition: A | Discharge status: Home / Self Care | Payer: 59 | Source: Ambulatory Visit | Attending: Urology | Admitting: Urology

## 2019-10-14 ENCOUNTER — Other Ambulatory Visit: Payer: Self-pay | Admitting: Urology

## 2019-10-14 ENCOUNTER — Ambulatory Visit
Admission: RE | Admit: 2019-10-14 | Discharge: 2019-10-14 | Disposition: A | Discharge status: Home / Self Care | Payer: 59 | Source: Ambulatory Visit | Attending: Radiation Oncology | Admitting: Radiation Oncology

## 2019-10-14 DIAGNOSIS — C61 Malignant neoplasm of prostate: Secondary | ICD-10-CM

## 2019-10-15 NOTE — Progress Notes (Signed)
  Radiation Oncology         [phone]) [phone] ________________________________  Name: Thomas Watson MRN: [id number]  Date: 10/14/2019  DOB: 06-03-1961  SIMULATION AND TREATMENT PLANNING NOTE PUBIC ARCH STUDY  OB:[id number], Cletus Gash, MD  [name], MD  DIAGNOSIS: 59 y.o. gentleman with Stage T1c adenocarcinoma of the prostate with Gleason score of 4+3, and PSA of 6.11    ICD-10-CM   1. Malignant neoplasm of prostate ([place])  C61     COMPLEX SIMULATION:  The patient presented today for evaluation for possible prostate seed implant. He was brought to the radiation planning suite and placed supine on the CT couch. A 3-dimensional image study set was obtained in upload to the planning computer. There, on each axial slice, I contoured the prostate gland. Then, using three-dimensional radiation planning tools I reconstructed the prostate in view of the structures from the transperineal needle pathway to assess for possible pubic arch interference. In doing so, I did not appreciate any pubic arch interference. Also, the patient's prostate volume was estimated based on the drawn structure. The volume was 26 cc.  Given the pubic arch appearance and prostate volume, patient remains a good candidate to proceed with prostate seed implant. Today, he freely provided informed written consent to proceed.    PLAN: The patient will undergo prostate seed implant.   ________________________________  Sheral Apley. Tammi Klippel, M.D.

## 2019-11-26 ENCOUNTER — Other Ambulatory Visit: Payer: Self-pay

## 2019-11-26 ENCOUNTER — Telehealth: Payer: Self-pay | Admitting: *Deleted

## 2019-11-26 ENCOUNTER — Encounter (HOSPITAL_BASED_OUTPATIENT_CLINIC_OR_DEPARTMENT_OTHER): Payer: Self-pay | Admitting: Urology

## 2019-11-26 NOTE — Progress Notes (Signed)
Spoke w/ via phone for pre-op interview---patient Lab needs dos---- none             Lab results------chest xray 10-14-2019 epic, ekg 10-14-2019 epic, has lab appt 12-03-2019@850  am for cbc, cmet, pt, ptt COVID test ------12-03-2019@945  am Arrive at -------1100 am 12-06-2019 No food after midnight, clear liquids from midnight until 700 am then npo Medications to take morning of surgery -----none Diabetic medication -----n/a Patient Special Instructions -----fleets enema am of surgery Pre-Op special Istructions -----none Patient verbalized understanding of instructions that were given at this phone interview. Patient denies shortness of breath, chest pain, fever, cough a this phone interview.

## 2019-11-26 NOTE — Telephone Encounter (Signed)
CALLED PATIENT TO REMIND OF LAB AND COVID TESTING FOR 12-03-19, SPOKE WITH PATIENT AND HE IS AWARE OF THESE APPTS. °

## 2019-11-29 NOTE — Discharge Instructions (Addendum)
Post Anesthesia Home Care Instructions  Activity: Get plenty of rest for the remainder of the day. A responsible adult should stay with you for 24 hours following the procedure.  For the next 24 hours, DO NOT: -Drive a car -Paediatric nurse -Drink alcoholic beverages -Take any medication unless instructed by your physician -Make any legal decisions or sign important papers.  Meals: Start with liquid foods such as gelatin or soup. Progress to regular foods as tolerated. Avoid greasy, spicy, heavy foods. If nausea and/or vomiting occur, drink only clear liquids until the nausea and/or vomiting subsides. Call your physician if vomiting continues.  Special Instructions/Symptoms: Your throat may feel dry or sore from the anesthesia or the breathing tube placed in your throat during surgery. If this causes discomfort, gargle with warm salt water. The discomfort should disappear within 24 hours.  If you had a scopolamine patch placed behind your ear for the management of post- operative nausea and/or vomiting:  1. The medication in the patch is effective for 72 hours, after which it should be removed.  Wrap patch in a tissue and discard in the trash. Wash hands thoroughly with soap and water. 2. You may remove the patch earlier than 72 hours if you experience unpleasant side effects which may include dry mouth, dizziness or visual disturbances. 3. Avoid touching the patch. Wash your hands with soap and water after contact with the patch.   DISCHARGE INSTRUCTIONS FOR PROSTATE SEED IMPLANTATION  Antibiotics You may be given a prescription for an antibiotic to take when you arrive home. If so, be sure to take every tablet in the bottle, even if you are feeling better before the prescription is finished. If you begin itching, notice a rash or start to swell on your trunk, arms, legs and/or throat, immediately stop taking the antibiotic and call your Urologist. Diet Resume your usual diet when you  return home. To keep your bowels moving easily and softly, drink prune, apple and cranberry juice at room temperature. You may also take a stool softener, such as Colace, which is available without prescription at local pharmacies. Daily activities ? No driving or heavy lifting for at least two days after the implant. ? No bike riding, horseback riding or riding lawn mowers for the first month after the implant. ? Any strenuous physical activity should be approved by your doctor before you resume it. Sexual relations You may resume sexual relations two weeks after the procedure. A condom should be used for the first two weeks. Your semen may be dark brown or black; this is normal and is related bleeding that may have occurred during the implant. Postoperative swelling Expect swelling and bruising of the scrotum and perineum (the area between the scrotum and anus). Both the swelling and the bruising should resolve in l or 2 weeks. Ice packs and over- the-counter medications such as Tylenol, Advil or Aleve may lessen your discomfort. Postoperative urination Most men experience burning on urination and/or urinary frequency. If this becomes bothersome, contact your Urologist.  Medication can be prescribed to relieve these problems.  It is normal to have some blood in your urine for a few days after the implant. Special instructions related to the seeds It is unlikely that you will pass an Iodine-125 seed in your urine. The seeds are silver in color and are about as large as a grain of rice. If you pass a seed, do not handle it with your fingers. Use a spoon to place it in an  envelope or jar in place this in base occluded area such as the garage or basement for return to the radiation clinic at your convenience.  Contact your doctor for ? Temperature greater than 101 F ? Increasing pain ? Inability to urinate Follow-up  You should have follow up with your urologist and radiation oncologist about 3  weeks after the procedure. General information regarding Iodine seeds ? Iodine-125 is a low energy radioactive material. It is not deeply penetrating and loses energy at short distances. Your prostate will absorb the radiation. Objects that are touched or used by the patient do not become radioactive. ? Body wastes (urine and stool) or body fluids (saliva, tears, semen or blood) are not radioactive. ? The Nuclear Regulatory Commission [facility]) has determined that no radiation precautions are needed for patients undergoing Iodine-125 seed implantation. The [facility] states that such patients do not present a risk to the people around them, including young children and pregnant women. However, in keeping with the general principle that radiation exposure should be kept as low reasonably possible, we suggest the following: ? Children and pets should not sit on the patient's lap for the first two (2) weeks after the implant. ? Pregnant (or possibly pregnant) women should avoid prolonged, close contact with the patient for the first two (2) weeks after the implant. ? A distance of three (3) feet is acceptable. ? At a distance of three (3) feet, there is no limit to the length of time anyone can be with the patient. ?

## 2019-11-29 NOTE — H&P (Signed)
CC/HPI: Prostate cancer:  PSA 07/07/19 - 6.11, Prostate was smooth and symmetric but seemed may be just slightly firmer on the right-hand side than the left.  TRUS/Bx 08/10/19: Prostate volume - 27 cc  Pathology: 3+3 in 2 cores, 3+4 in 2 cores and 4+3 in 3 cores (total 7/12 cores positive).  Stage: T1c (unfavorable, immediate risk)   Partin table results:  5/10 year progression-free probability with radical prostatectomy - 63%/47%  EC - 42%  CP - 54%  SV - 13%  LN - 12%   09/07/19: The patient has returned today to discuss management of his newly diagnosed prostate cancer. Due to the fact that he had unfavorable, intermediate risk prostate cancer with probability of lymph node involvement of > 10% according to NCCN guidelines I scheduled a CT scan however his insurance denied the study.     ALLERGIES: No Allergies    MEDICATIONS: Tamsulosin Hcl 0.4 mg capsule 1 capsule PO Q PM  Tamsulosin Hcl 0.4 mg capsule 1 capsule PO Q PM  Multivitamin  Saw Palmetto  Vitamin D3     GU PSH: Prostate Needle Biopsy - 08/10/2019     NON-GU PSH: Surgical Pathology, Gross And Microscopic Examination For Prostate Needle - 08/10/2019     GU PMH: Prostate Cancer, Because he had unfavorable intermediate prostate cancer with a greater than 10% probability of lymph node involvement as CT scan will be obtained and I will have him return to go over the results and discuss options for treatment. - 08/18/2019    NON-GU PMH: Acute gastric ulcer with hemorrhage GERD    FAMILY HISTORY: No Family History    SOCIAL HISTORY: Marital Status: Married Preferred Language: English; Ethnicity: Not Hispanic Or Latino; Race: Black or African American Current Smoking Status: Patient does not smoke anymore.   Tobacco Use Assessment Completed: Used Tobacco in last 30 days? Does drink.  Drinks 1 caffeinated drink per day.    REVIEW OF SYSTEMS:    GU Review Male:   Patient denies frequent urination, hard to postpone  urination, burning/ pain with urination, get up at night to urinate, leakage of urine, stream starts and stops, trouble starting your stream, have to strain to urinate , erection problems, and penile pain.  Gastrointestinal (Upper):   Patient denies nausea, vomiting, and indigestion/ heartburn.  Gastrointestinal (Lower):   Patient denies diarrhea and constipation.  Constitutional:   Patient denies weight loss, fever, fatigue, and night sweats.  Skin:   Patient denies skin rash/ lesion and itching.  Eyes:   Patient denies blurred vision and double vision.  Ears/ Nose/ Throat:   Patient denies sore throat and sinus problems.  Hematologic/Lymphatic:   Patient denies swollen glands and easy bruising.  Cardiovascular:   Patient denies leg swelling and chest pains.  Respiratory:   Patient denies cough and shortness of breath.  Endocrine:   Patient denies excessive thirst.  Musculoskeletal:   Patient denies back pain and joint pain.  Neurological:   Patient denies headaches and dizziness.  Psychologic:   Patient denies depression and anxiety.   VITAL SIGNS:    Weight 145 lb / 65.77 kg  Height 68 in / 172.72 cm  BP 116/72 mmHg  Pulse 94 /min  BMI 22.0 kg/m   GU PHYSICAL EXAMINATION:    Anus and Perineum: No hemorrhoids. No anal stenosis. No rectal fissure, no anal fissure. No edema, no dimple, no perineal tenderness, no anal tenderness.  Scrotum: No lesions. No edema. No cysts. No warts.  Epididymides: Right: no spermatocele, no masses, no cysts, no tenderness, no induration, no enlargement. Left: no spermatocele, no masses, no cysts, no tenderness, no induration, no enlargement.  Testes: No tenderness, no swelling, no enlargement left testes. No tenderness, no swelling, no enlargement right testes. Normal location left testes. Normal location right testes. No mass, no cyst, no varicocele, no hydrocele left testes. No mass, no cyst, no varicocele, no hydrocele right testes.  Urethral Meatus:  Normal size. No lesion, no wart, no discharge, no polyp. Normal location.  Penis: Circumcised, no warts, no cracks. No dorsal Peyronie's plaques, no left corporal Peyronie's plaques, no right corporal Peyronie's plaques, no scarring, no warts. No balanitis, no meatal stenosis.  Prostate: 40 gram or 2+ size. Prostate was smooth and symmetric but seemed may be just slightly firmer on the right-hand side than the left. No worrisome nodularity was noted.  Seminal Vesicles: Nonpalpable.  Sphincter Tone: Normal sphincter. No rectal tenderness. No rectal mass.    MULTI-SYSTEM PHYSICAL EXAMINATION:    Constitutional: Well-nourished. No physical deformities. Normally developed. Good grooming.  Neck: Neck symmetrical, not swollen. Normal tracheal position.  Respiratory: No labored breathing, no use of accessory muscles.   Cardiovascular: Normal temperature, normal extremity pulses, no swelling, no varicosities.  Lymphatic: No enlargement of neck, axillae, groin.  Skin: No paleness, no jaundice, no cyanosis. No lesion, no ulcer, no rash.  Neurologic / Psychiatric: Oriented to time, oriented to place, oriented to person. No depression, no anxiety, no agitation.  Gastrointestinal: No mass, no tenderness, no rigidity, non obese abdomen.  Eyes: Normal conjunctivae. Normal eyelids.  Ears, Nose, Mouth, and Throat: Left ear no scars, no lesions, no masses. Right ear no scars, no lesions, no masses. Nose no scars, no lesions, no masses. Normal hearing. Normal lips.  Musculoskeletal: Normal gait and station of head and neck.    PAST DATA REVIEWED:  Source Of History:  Patient   07/07/19 04/29/19  PSA  Total PSA 6.11 ng/mL 5.7 ng/dl  Free PSA 0.66 ng/mL   % Free PSA 11 % PSA 8.6 %    PROCEDURES:          Urinalysis Dipstick Dipstick Cont'd  Color: Yellow Bilirubin: Neg mg/dL  Appearance: Clear Ketones: Neg mg/dL  Specific Gravity: 1.025 Blood: Neg ery/uL  pH: 6.0 Protein: Neg mg/dL  Glucose: Neg  mg/dL Urobilinogen: 0.2 mg/dL    Nitrites: Neg    Leukocyte Esterase: Neg leu/uL    ASSESSMENT/PLAN:      ICD-10 Details  1 GU:   Prostate Cancer - C61 Undiagnosed New Problem              Notes:   We discussed proceeding with treatment. I again went over the options including radiation and surgery. He indicated that he did not want to consider surgery and therefore I discussed radiation with him including both external radiation and radioactive seed implantation.  He met with Dr. Tammi Klippel and discussed treatment with radiation and has elected to proceed with radioactive seed implant.

## 2019-12-03 ENCOUNTER — Other Ambulatory Visit: Payer: Self-pay

## 2019-12-03 ENCOUNTER — Other Ambulatory Visit (HOSPITAL_COMMUNITY)
Admission: RE | Admit: 2019-12-03 | Discharge: 2019-12-03 | Disposition: A | Discharge status: Home / Self Care | Payer: No Typology Code available for payment source | Source: Ambulatory Visit | Attending: Urology | Admitting: Urology

## 2019-12-03 ENCOUNTER — Encounter (HOSPITAL_COMMUNITY)
Admission: RE | Admit: 2019-12-03 | Discharge: 2019-12-03 | Disposition: A | Discharge status: Home / Self Care | Payer: No Typology Code available for payment source | Source: Ambulatory Visit | Attending: Urology | Admitting: Urology

## 2019-12-03 DIAGNOSIS — Z20822 Contact with and (suspected) exposure to covid-19: Secondary | ICD-10-CM | POA: Insufficient documentation

## 2019-12-03 DIAGNOSIS — Z01812 Encounter for preprocedural laboratory examination: Secondary | ICD-10-CM | POA: Insufficient documentation

## 2019-12-03 LAB — COMPREHENSIVE METABOLIC PANEL
ALT: 33 U/L (ref 0–44)
AST: 24 U/L (ref 15–41)
Albumin: 4.3 g/dL (ref 3.5–5.0)
Alkaline Phosphatase: 54 U/L (ref 38–126)
Anion gap: 8 (ref 5–15)
BUN: 17 mg/dL (ref 6–20)
CO2: 25 mmol/L (ref 22–32)
Calcium: 9.2 mg/dL (ref 8.9–10.3)
Chloride: 107 mmol/L (ref 98–111)
Creatinine, Ser: 0.94 mg/dL (ref 0.61–1.24)
GFR calc Af Amer: >60 mL/min (ref >60)
GFR calc non Af Amer: >60 mL/min (ref >60)
Glucose, Bld: 91 mg/dL (ref 70–99)
Potassium: 4.5 mmol/L (ref 3.5–5.1)
Sodium: 140 mmol/L (ref 135–145)
Total Bilirubin: 0.6 mg/dL (ref 0.3–1.2)
Total Protein: 7.1 g/dL (ref 6.5–8.1)

## 2019-12-03 LAB — CBC
HCT: 46.8 % (ref 39.0–52.0)
Hemoglobin: 15.1 g/dL (ref 13.0–17.0)
MCH: 29.7 pg (ref 26.0–34.0)
MCHC: 32.3 g/dL (ref 30.0–36.0)
MCV: 91.9 fL (ref 80.0–100.0)
Platelets: 319 10*3/uL (ref 150–400)
RBC: 5.09 MIL/uL (ref 4.22–5.81)
RDW: 14.8 % (ref 11.5–15.5)
WBC: 6.6 10*3/uL (ref 4.0–10.5)
nRBC: 0 % (ref 0.0–0.2)

## 2019-12-03 LAB — PROTIME-INR
INR: 1 (ref 0.8–1.2)
Prothrombin Time: 12.8 seconds (ref 11.4–15.2)

## 2019-12-03 LAB — APTT: aPTT: 35 seconds (ref 24–36)

## 2019-12-03 LAB — SARS CORONAVIRUS 2 (TAT 6-24 HRS): SARS Coronavirus 2: NEGATIVE

## 2019-12-06 ENCOUNTER — Encounter (HOSPITAL_BASED_OUTPATIENT_CLINIC_OR_DEPARTMENT_OTHER): Payer: Self-pay | Admitting: Urology

## 2019-12-06 ENCOUNTER — Other Ambulatory Visit: Payer: Self-pay

## 2019-12-06 ENCOUNTER — Ambulatory Visit (HOSPITAL_BASED_OUTPATIENT_CLINIC_OR_DEPARTMENT_OTHER): Payer: No Typology Code available for payment source | Admitting: Anesthesiology

## 2019-12-06 ENCOUNTER — Encounter (HOSPITAL_BASED_OUTPATIENT_CLINIC_OR_DEPARTMENT_OTHER)
Admission: RE | Disposition: A | Discharge status: Home / Self Care | Payer: Self-pay | Source: Ambulatory Visit | Attending: Urology

## 2019-12-06 ENCOUNTER — Ambulatory Visit (HOSPITAL_COMMUNITY): Payer: No Typology Code available for payment source

## 2019-12-06 ENCOUNTER — Ambulatory Visit (HOSPITAL_BASED_OUTPATIENT_CLINIC_OR_DEPARTMENT_OTHER)
Admission: RE | Admit: 2019-12-06 | Discharge: 2019-12-06 | Disposition: A | Discharge status: Home / Self Care | Payer: No Typology Code available for payment source | Source: Ambulatory Visit | Attending: Urology | Admitting: Urology

## 2019-12-06 DIAGNOSIS — C61 Malignant neoplasm of prostate: Secondary | ICD-10-CM | POA: Diagnosis present

## 2019-12-06 DIAGNOSIS — Z87891 Personal history of nicotine dependence: Secondary | ICD-10-CM | POA: Insufficient documentation

## 2019-12-06 HISTORY — PX: RADIOACTIVE SEED IMPLANT: SHX5150

## 2019-12-06 HISTORY — PX: SPACE OAR INSTILLATION: SHX6769

## 2019-12-06 SURGERY — INSERTION, RADIATION SOURCE, PROSTATE
Anesthesia: General

## 2019-12-06 MED ORDER — LIDOCAINE 2% (20 MG/ML) 5 ML SYRINGE
INTRAMUSCULAR | Status: DC | PRN
  Administered 2019-12-06: 60 mg via INTRAVENOUS

## 2019-12-06 MED ORDER — MIDAZOLAM HCL 2 MG/2ML IJ SOLN
INTRAMUSCULAR | Status: AC
  Filled 2019-12-06: qty 2

## 2019-12-06 MED ORDER — CIPROFLOXACIN IN D5W 400 MG/200ML IV SOLN
INTRAVENOUS | Status: AC
  Filled 2019-12-06: qty 200

## 2019-12-06 MED ORDER — SODIUM CHLORIDE 0.9 % IV SOLN
INTRAVENOUS | Status: AC | PRN
  Administered 2019-12-06: 1000 mL

## 2019-12-06 MED ORDER — FENTANYL CITRATE (PF) 100 MCG/2ML IJ SOLN
INTRAMUSCULAR | Status: DC | PRN
  Administered 2019-12-06 (×2): 50 ug via INTRAVENOUS

## 2019-12-06 MED ORDER — FENTANYL CITRATE (PF) 100 MCG/2ML IJ SOLN
INTRAMUSCULAR | Status: AC
  Filled 2019-12-06: qty 2

## 2019-12-06 MED ORDER — SODIUM CHLORIDE (PF) 0.9 % IJ SOLN
INTRAMUSCULAR | Status: DC | PRN
  Administered 2019-12-06: 10 mL

## 2019-12-06 MED ORDER — IOHEXOL 300 MG/ML  SOLN
INTRAMUSCULAR | Status: DC | PRN
  Administered 2019-12-06: 7 mL

## 2019-12-06 MED ORDER — LACTATED RINGERS IV SOLN: INTRAVENOUS | Status: DC

## 2019-12-06 MED ORDER — FLEET ENEMA 7-19 GM/118ML RE ENEM
1.0000 | ENEMA | Freq: Once | RECTAL | Status: AC
  Administered 2019-12-06: 1 via RECTAL

## 2019-12-06 MED ORDER — STERILE WATER FOR IRRIGATION IR SOLN
Status: DC | PRN
Start: 2019-12-06 — End: 2019-12-06
  Administered 2019-12-06: 500 mL

## 2019-12-06 MED ORDER — MIDAZOLAM HCL 5 MG/5ML IJ SOLN
INTRAMUSCULAR | Status: DC | PRN
  Administered 2019-12-06: 2 mg via INTRAVENOUS

## 2019-12-06 MED ORDER — HYDROCODONE-ACETAMINOPHEN 7.5-325 MG PO TABS: 1.0000 | ORAL_TABLET | ORAL | 0 refills | Status: DC | PRN

## 2019-12-06 MED ORDER — ONDANSETRON HCL 4 MG/2ML IJ SOLN
INTRAMUSCULAR | Status: DC | PRN
  Administered 2019-12-06: 4 mg via INTRAVENOUS

## 2019-12-06 MED ORDER — CIPROFLOXACIN IN D5W 400 MG/200ML IV SOLN
400.0000 mg | INTRAVENOUS | Status: AC
  Administered 2019-12-06: 400 mg via INTRAVENOUS

## 2019-12-06 MED ORDER — KETOROLAC TROMETHAMINE 30 MG/ML IJ SOLN
INTRAMUSCULAR | Status: DC | PRN
  Administered 2019-12-06: 30 mg via INTRAVENOUS

## 2019-12-06 MED ORDER — DEXAMETHASONE SODIUM PHOSPHATE 10 MG/ML IJ SOLN
INTRAMUSCULAR | Status: AC
  Filled 2019-12-06: qty 1

## 2019-12-06 MED ORDER — ONDANSETRON HCL 4 MG/2ML IJ SOLN
INTRAMUSCULAR | Status: AC
  Filled 2019-12-06: qty 2

## 2019-12-06 MED ORDER — PROPOFOL 10 MG/ML IV BOLUS
INTRAVENOUS | Status: AC
  Filled 2019-12-06: qty 20

## 2019-12-06 MED ORDER — PROPOFOL 10 MG/ML IV BOLUS
INTRAVENOUS | Status: DC | PRN
  Administered 2019-12-06: 170 mg via INTRAVENOUS
  Administered 2019-12-06: 30 mg via INTRAVENOUS
  Administered 2019-12-06: 50 mg via INTRAVENOUS

## 2019-12-06 MED ORDER — LIDOCAINE 2% (20 MG/ML) 5 ML SYRINGE
INTRAMUSCULAR | Status: AC
  Filled 2019-12-06: qty 5

## 2019-12-06 MED ORDER — DEXAMETHASONE SODIUM PHOSPHATE 10 MG/ML IJ SOLN
INTRAMUSCULAR | Status: DC | PRN
  Administered 2019-12-06: 10 mg via INTRAVENOUS

## 2019-12-06 SURGICAL SUPPLY — 34 items
BAG URINE DRAIN 2000ML AR STRL (UROLOGICAL SUPPLIES) ×2 IMPLANT
BLADE CLIPPER SENSICLIP SURGIC (BLADE) ×2 IMPLANT
CATH FOLEY 2WAY SLVR  5CC 16FR (CATHETERS) ×1
CATH FOLEY 2WAY SLVR 5CC 16FR (CATHETERS) ×1 IMPLANT
CATH ROBINSON RED A/P 16FR (CATHETERS) IMPLANT
CATH ROBINSON RED A/P 20FR (CATHETERS) ×2 IMPLANT
CLOTH BEACON ORANGE TIMEOUT ST (SAFETY) ×2 IMPLANT
CNTNR URN SCR LID CUP LEK RST (MISCELLANEOUS) ×2 IMPLANT
CONT SPEC 4OZ STRL OR WHT (MISCELLANEOUS) ×2
COVER BACK TABLE 60X90IN (DRAPES) ×2 IMPLANT
COVER MAYO STAND STRL (DRAPES) ×2 IMPLANT
DRSG TEGADERM 4X4.75 (GAUZE/BANDAGES/DRESSINGS) ×3 IMPLANT
DRSG TEGADERM 8X12 (GAUZE/BANDAGES/DRESSINGS) ×4 IMPLANT
GAUZE SPONGE 4X4 12PLY STRL (GAUZE/BANDAGES/DRESSINGS) ×1 IMPLANT
GLOVE BIO SURGEON STRL SZ7.5 (GLOVE) IMPLANT
GLOVE BIO SURGEON STRL SZ8 (GLOVE) ×2 IMPLANT
GLOVE SURG ORTHO 8.5 STRL (GLOVE) ×2 IMPLANT
GLOVE SURG SS PI 6.5 STRL IVOR (GLOVE) IMPLANT
GOWN STRL REUS W/TWL XL LVL3 (GOWN DISPOSABLE) ×2 IMPLANT
HOLDER FOLEY CATH W/STRAP (MISCELLANEOUS) IMPLANT
I-SEED AGX100 ×1 IMPLANT
IMPL SPACEOAR VUE SYSTEM (Spacer) IMPLANT
IMPLANT SPACEOAR VUE SYSTEM (Spacer) ×2 IMPLANT
IV NS 1000ML (IV SOLUTION) ×1
IV NS 1000ML BAXH (IV SOLUTION) ×1 IMPLANT
KIT TURNOVER CYSTO (KITS) ×2 IMPLANT
MARKER SKIN DUAL TIP RULER LAB (MISCELLANEOUS) ×2 IMPLANT
SURGILUBE 2OZ TUBE FLIPTOP (MISCELLANEOUS) ×2 IMPLANT
SUT BONE WAX W31G (SUTURE) IMPLANT
SYR 10ML LL (SYRINGE) IMPLANT
TOWEL OR 17X26 10 PK STRL BLUE (TOWEL DISPOSABLE) ×2 IMPLANT
TRAY CYSTO PACK (CUSTOM PROCEDURE TRAY) ×2 IMPLANT
UNDERPAD 30X30 (UNDERPADS AND DIAPERS) ×4 IMPLANT
WATER STERILE IRR 500ML POUR (IV SOLUTION) ×2 IMPLANT

## 2019-12-06 NOTE — Transfer of Care (Signed)
Immediate Anesthesia Transfer of Care Note  Patient: Thomas Watson  Procedure(s) Performed: RADIOACTIVE SEED IMPLANT/BRACHYTHERAPY IMPLANT (N/A ) SPACE OAR INSTILLATION (N/A )  Patient Location: PACU  Anesthesia Type:General  Level of Consciousness: drowsy  Airway & Oxygen Therapy: Patient Spontanous Breathing and Patient connected to nasal cannula oxygen  Post-op Assessment: Report given to RN  Post vital signs: Reviewed and stable  Last Vitals:  Vitals Value Taken Time  BP    Temp    Pulse 76 12/06/19 1401  Resp 18 12/06/19 1401  SpO2 100 % 12/06/19 1401  Vitals shown include unvalidated device data.  Last Pain:  Vitals:   12/06/19 1123  TempSrc: Oral  PainSc: 0-No pain      Patients Stated Pain Goal: 5 (12/92/90 9030)  Complications: No apparent anesthesia complications

## 2019-12-06 NOTE — Op Note (Signed)
PATIENT:  Thomas Watson  PRE-OPERATIVE DIAGNOSIS:  Adenocarcinoma of the prostate  POST-OPERATIVE DIAGNOSIS:  Same  PROCEDURE:  1. I-125 radioactive seed implantation 2. Cystoscopy  3. Placement of SpaceOAR 4. Fluoroscopy use with time less than 1 hour.  SURGEON:  Surgeon(s): Claybon Jabs  Radiation oncologist: Dr. Tyler Pita  ANESTHESIA:  General  EBL:  Minimal  DRAINS: None  INDICATION: Thomas Watson is a 59 year old male with biopsy-proven adenocarcinoma of the prostate.  He has elected to proceed with radioactive seed implant and presents today for that procedure.  Description of procedure: After informed consent the patient was brought to the major OR, placed on the table and administered general anesthesia. He was then moved to the modified lithotomy position with his perineum perpendicular to the floor. His perineum and genitalia were then sterilely prepped. An official timeout was then performed. A 16 French Foley catheter was then placed in the bladder and filled with dilute contrast, a rectal tube was placed in the rectum and the transrectal ultrasound probe was placed in the rectum and affixed to the stand. He was then sterilely draped.  Real time ultrasonography was used along with the seed planning software [employer] Prostate vs. 4.2.2.4. This was used to develop the seed plan including the number of needles as well as number of seeds required for complete and adequate coverage.  The needles were then preloaded with seeds and spacers according to the previously developed plan.  Real-time ultrasonography was then used along with the previously developed plan to implant a total of 71 seeds using 22 needles. This proceeded without difficulty or complication.   I then proceeded with placement of SpaceOAR by introducing a needle with the bevel angled inferiorly approximately 2 cm superior to the anus. This was angled downward and under direct ultrasound was placed  within the space between the prostatic capsule and rectum. This was confirmed with a small amount of sterile saline injected and this was performed under direct ultrasound. I then attached the SpaceOAR to the needle and injected this in the space between the prostate and rectum with good placement noted.  A Foley catheter was then removed as well as the transrectal ultrasound probe and rectal probe. Flexible cystoscopy was then performed using the 17 French flexible scope which revealed a normal urethra throughout its length down to the sphincter which appeared intact. The prostatic urethra revealed bilobar hypertrophy but no evidence of obstruction, seeds, spacers or lesions. The bladder was then entered and fully and systematically inspected. The ureteral orifices were noted to be of normal configuration and position. The mucosa revealed no evidence of tumors. There were also no stones identified within the bladder. I noted no seeds or spacers on the floor of the bladder and retroflexion of the scope revealed no seeds protruding from the base of the prostate.  C-arm fluoroscopy was then used to evaluate the distribution of seeds placement and aid in determining confirmation of the number of seeds placed.  Real-time fluoroscopy was used with saved images revealing the location of the seeds placed both in AP and oblique views.  The cystoscope was then removed and the patient was awakened and taken to recovery room in stable and satisfactory condition. He tolerated procedure well and there were no intraoperative complications.

## 2019-12-06 NOTE — Anesthesia Preprocedure Evaluation (Addendum)
Anesthesia Evaluation  Patient identified by MRN, date of birth, ID band Patient awake    Reviewed: Allergy & Precautions, NPO status , Patient's Chart, lab work & pertinent test results  Airway Mallampati: I  TM Distance: >3 FB Neck ROM: Full    Dental  (+) Missing, Partial Upper,    Pulmonary Current Smoker and Patient abstained from smoking.,    breath sounds clear to auscultation       Cardiovascular negative cardio ROS   Rhythm:Regular Rate:Normal     Neuro/Psych negative neurological ROS  negative psych ROS   GI/Hepatic negative GI ROS, Neg liver ROS,   Endo/Other  negative endocrine ROS  Renal/GU negative Renal ROS     Musculoskeletal negative musculoskeletal ROS (+)   Abdominal Normal abdominal exam  (+)   Peds  Hematology negative hematology ROS (+)   Anesthesia Other Findings   Reproductive/Obstetrics                            Anesthesia Physical Anesthesia Plan  ASA: II  Anesthesia Plan: General   Post-op Pain Management:    Induction: Intravenous  PONV Risk Score and Plan: 2 and Ondansetron and Midazolam  Airway Management Planned: LMA  Additional Equipment: None  Intra-op Plan:   Post-operative Plan: Extubation in OR  Informed Consent: I have reviewed the patients History and Physical, chart, labs and discussed the procedure including the risks, benefits and alternatives for the proposed anesthesia with the patient or authorized representative who has indicated his/her understanding and acceptance.     Dental advisory given  Plan Discussed with: CRNA  Anesthesia Plan Comments: (EKG: normal sinus rhythm. )       Anesthesia Quick Evaluation

## 2019-12-06 NOTE — Anesthesia Procedure Notes (Signed)
Procedure Name: LMA Insertion Date/Time: 12/06/2019 12:55 PM Performed by: Bonney Aid, CRNA Pre-anesthesia Checklist: Patient identified, Emergency Drugs available, Suction available and Patient being monitored Patient Re-evaluated:Patient Re-evaluated prior to induction Oxygen Delivery Method: Circle system utilized Preoxygenation: Pre-oxygenation with 100% oxygen Induction Type: IV induction Ventilation: Mask ventilation without difficulty LMA: LMA inserted LMA Size: 5.0 Number of attempts: 1 Airway Equipment and Method: Bite block Placement Confirmation: positive ETCO2 Dental Injury: Teeth and Oropharynx as per pre-operative assessment

## 2019-12-08 NOTE — Progress Notes (Signed)
  Radiation Oncology         [phone]) [phone] ________________________________  Name: Thomas Watson MRN: [id number]  Date: 12/08/2019  DOB: 1960/07/06       Prostate Seed Implant  YY:[id number], Cletus Gash, MD  No ref. provider found  DIAGNOSIS:  Oncology History Overview Note  59 y.o. gentleman with Stage T1c adenocarcinoma of the prostate with Gleason score of 4+3, and PSA of 6.11   Malignant neoplasm of prostate ([place])  08/10/2019 Cancer Staging   Staging form: Prostate, AJCC 8th Edition - Clinical stage from 08/10/2019: Stage IIC (cT1c, cN0, cM0, PSA: 6.1, Grade Group: 3) - Signed by Freeman Caldron, PA-C on 09/17/2019   09/17/2019 Initial Diagnosis   Malignant neoplasm of prostate ([place])       ICD-10-CM   1. Malignant neoplasm of prostate [facility])  [place] Discharge patient    PROCEDURE: Insertion of radioactive I-125 seeds into the prostate gland.  RADIATION DOSE: 145 Gy, definitive therapy.  TECHNIQUE: Thomas Watson was brought to the operating room with the urologist. He was placed in the dorsolithotomy position. He was catheterized and a rectal tube was inserted. The perineum was shaved, prepped and draped. The ultrasound probe was then introduced into the rectum to see the prostate gland.  TREATMENT DEVICE: A needle grid was attached to the ultrasound probe stand and anchor needles were placed.  3D PLANNING: The prostate was imaged in 3D using a sagittal sweep of the prostate probe. These images were transferred to the planning computer. There, the prostate, urethra and rectum were defined on each axial reconstructed image. Then, the software created an optimized 3D plan and a few seed positions were adjusted. The quality of the plan was reviewed using [facility] information for the target and the following two organs at risk:  Urethra and Rectum.  Then the accepted plan was printed and handed off to the radiation therapist.  Under my supervision, the custom loading of the seeds and spacers was carried  out and loaded into sealed vicryl sleeves.  These pre-loaded needles were then placed into the needle holder.Marland Kitchen  PROSTATE VOLUME STUDY:  Using transrectal ultrasound the volume of the prostate was verified to be 31.4 cc.  SPECIAL TREATMENT PROCEDURE/SUPERVISION AND HANDLING: The pre-loaded needles were then delivered under sagittal guidance. A total of 22 needles were used to deposit 71 seeds in the prostate gland. The individual seed activity was 0.365 mCi.  SpaceOAR:  Yes, VUE  COMPLEX SIMULATION: At the end of the procedure, an anterior radiograph of the pelvis was obtained to document seed positioning and count. Cystoscopy was performed to check the urethra and bladder.  MICRODOSIMETRY: At the end of the procedure, the patient was emitting 0.27 mR/hr at 1 meter. Accordingly, he was considered safe for hospital discharge.  PLAN: The patient will return to the radiation oncology clinic for post implant CT dosimetry in three weeks.   ________________________________  Sheral Apley Tammi Klippel, M.D.

## 2019-12-10 NOTE — Anesthesia Postprocedure Evaluation (Signed)
Anesthesia Post Note  Patient: Thomas Watson  Procedure(s) Performed: RADIOACTIVE SEED IMPLANT/BRACHYTHERAPY IMPLANT (N/A ) SPACE OAR INSTILLATION (N/A )     Patient location during evaluation: PACU Anesthesia Type: General Level of consciousness: awake Pain management: pain level controlled Vital Signs Assessment: post-procedure vital signs reviewed and stable Respiratory status: spontaneous breathing Cardiovascular status: blood pressure returned to baseline Postop Assessment: no apparent nausea or vomiting Anesthetic complications: no   No complications documented.  Last Vitals:  Vitals:   12/06/19 1432 12/06/19 1500  BP: (!) 137/91 (!) 141/90  Pulse: 67 69  Resp: 15 16  Temp: (!) 36.4 C   SpO2: 99% 99%    Last Pain:  Vitals:   12/07/19 1223  TempSrc:   PainSc: Thomas Watson

## 2020-01-05 ENCOUNTER — Telehealth: Payer: Self-pay | Admitting: *Deleted

## 2020-01-05 NOTE — Telephone Encounter (Signed)
CALLED PATIENT TO REMIND OF POST SEED APPTS. FOR 01-06-20, SPOKE WITH PATIENT AND HE IS AWARE OF THESE APPTS.

## 2020-01-06 ENCOUNTER — Ambulatory Visit
Admission: RE | Admit: 2020-01-06 | Discharge: 2020-01-06 | Disposition: A | Discharge status: Home / Self Care | Payer: No Typology Code available for payment source | Source: Ambulatory Visit | Attending: Radiation Oncology | Admitting: Radiation Oncology

## 2020-01-06 ENCOUNTER — Ambulatory Visit
Admission: RE | Admit: 2020-01-06 | Discharge: 2020-01-06 | Disposition: A | Discharge status: Home / Self Care | Payer: No Typology Code available for payment source | Source: Ambulatory Visit | Attending: Urology | Admitting: Urology

## 2020-01-06 ENCOUNTER — Encounter: Payer: Self-pay | Admitting: Urology

## 2020-01-06 ENCOUNTER — Encounter: Payer: Self-pay | Admitting: Medical Oncology

## 2020-01-06 ENCOUNTER — Other Ambulatory Visit: Payer: Self-pay

## 2020-01-06 VITALS — BP 131/80 | HR 82 | Temp 98.9°F | Resp 18 | Ht 68.0 in | Wt 146.6 lb

## 2020-01-06 DIAGNOSIS — R3911 Hesitancy of micturition: Secondary | ICD-10-CM | POA: Insufficient documentation

## 2020-01-06 DIAGNOSIS — R350 Frequency of micturition: Secondary | ICD-10-CM | POA: Insufficient documentation

## 2020-01-06 DIAGNOSIS — Z79899 Other long term (current) drug therapy: Secondary | ICD-10-CM | POA: Insufficient documentation

## 2020-01-06 DIAGNOSIS — Z923 Personal history of irradiation: Secondary | ICD-10-CM | POA: Insufficient documentation

## 2020-01-06 DIAGNOSIS — C61 Malignant neoplasm of prostate: Secondary | ICD-10-CM | POA: Insufficient documentation

## 2020-01-06 NOTE — Progress Notes (Signed)
Received patient in the clinic for post seed follow up with Ashlyn Bruning, PA-C. Weight and vitals stable. Pre seed IPSS 6. Post seed IPSS 24. Patient reports his LUTS don't seem to be improving. Patient explains he was evaluated by his urologist on June 24 and prescribed Rapaflo in place of Flomax that made him dizzy. Patient verbalized that the Rapaflo doesn't help relieve his symptoms and has cause him to have a dry ejaculation. Reports dysuria. Denies hematuria. Reports new onset urinary leakage since having radioactive seeds placed. Patient's urologist Karsten Ro is retiring and he understands a Dr. Milford Cage will be taking over his care.   BP 131/80   Pulse 82   Temp 98.9 F (37.2 C)   Resp 18   Ht 5\' 8"  (1.727 m)   Wt 146 lb 9.6 oz (66.5 kg)   SpO2 100%   BMI 22.29 kg/m  Wt Readings from Last 3 Encounters:  01/06/20 146 lb 9.6 oz (66.5 kg)  01/06/20 146 lb 9.6 oz (66.5 kg)  12/06/19 146 lb 3.2 oz (66.3 kg)

## 2020-01-06 NOTE — Progress Notes (Signed)
Radiation Oncology         [phone]) [phone] ________________________________  Name: Thomas Watson MRN: [id number]  Date: 01/06/2020  DOB: [birth year]  Post-Seed Follow-Up Visit Note  CC: Thomas Fraise, MD  Thomas Rhodes, MD  Diagnosis:   59 y.o. gentleman with Stage T1c adenocarcinoma of the prostate with Gleason score of 4+3, and PSA of 6.11.    ICD-10-CM   1. Malignant neoplasm of prostate ([place])  C61     Interval Since Last Radiation:  4 weeks 12/06/19:  Insertion of radioactive I-125 seeds into the prostate gland; 145 Gy, definitive therapy with placement of SpaceOAR VUE gel.  Narrative:  The patient returns today for routine follow-up.  He is complaining of increased urinary frequency and urinary hesitation symptoms. He filled out a questionnaire regarding urinary function today providing and overall IPSS score of 24 characterizing his symptoms as severe with urgency, dysuria at onset of stream, weak stream, hesitancy, intermittency and nocturia x3/night.  He denies gross hematuria, fever or chills. His pre-implant score was 6. He was recently started on Rapaflo once daily which he is taking with his dinner nightly for the past week and has noticed some improvement in his flow ability to empty his bladder but notices that it seems to wear off during the day, before he is due to take the next dose. He was also concerned about the retrograde ejaculation that he experienced recently as he did not recall being told to expect this.  He denies any abdominal pain or bowel symptoms and has not noted any significant change in his energy level.  ALLERGIES:  is allergic to ibuprofen.  Meds: Current Outpatient Medications  Medication Sig Dispense Refill  . cholecalciferol (VITAMIN D3) 25 MCG (1000 UNIT) tablet Take 1,000 Units by mouth daily.    Marland Kitchen HYDROcodone-acetaminophen (NORCO) 7.5-325 MG tablet Take 1 tablet by mouth every 4 (four) hours as needed for moderate pain. Maximum dose per 24 hours - 8  pills 10 tablet 0  . Multiple Vitamin (MULTIVITAMIN) tablet Take 1 tablet by mouth daily.    . Saw Palmetto 1000 MG CAPS Take by mouth. Prostate pill prostate plus 1 tab per day     No current facility-administered medications for this encounter.    Physical Findings: In general this is a well appearing African American male in no acute distress. He's alert and oriented x4 and appropriate throughout the examination. Cardiopulmonary assessment is negative for acute distress and he exhibits normal effort.   Lab Findings: Lab Results  Component Value Date   WBC 6.6 12/03/2019   HGB 15.1 12/03/2019   HCT 46.8 12/03/2019   MCV 91.9 12/03/2019   PLT 319 12/03/2019    Radiographic Findings:  Patient underwent CT imaging in our clinic for post implant dosimetry. The CT will be reviewed by Dr. Tammi Watson to confirm there is an adequate distribution of radioactive seeds throughout the prostate gland and ensure that there are no seeds in or near the rectum. His procedure included use of SpaceOAR VUE gel which is easily visible on our CT scan so he will not need a prostate MRI. We suspect the final radiation plan and dosimetry will show appropriate coverage of the prostate gland. He understands that we will call and inform him of any unexpected findings on further review of his imaging and dosimetry.  Impression/Plan: 59 y.o. gentleman with Stage T1c adenocarcinoma of the prostate with Gleason score of 4+3, and PSA of 6.11. The patient is recovering from the effects  of radiation. His urinary symptoms should gradually improve over the next 4-6 months. We talked about this today. He is encouraged by his improvement already and is otherwise pleased with his outcome. We also talked about long-term follow-up for prostate cancer following seed implant. He understands that ongoing PSA determinations and digital rectal exams will help perform surveillance to rule out disease recurrence. He had a follow up  appointment with Thomas Watson on 12/27/19 and was started on Rapaflo to help with his post-treatment LUTS.  He has been referred to Dr. Harold Watson, one of Thomas Watson colleagues at [facility], to continue his care going forward since Thomas Watson is now retired. Follow up visit at Thomas Watson planned in 3 months to assess treatment response with post-treatment PSA but does not have an appointment date/time scheduled to his knowledge.  He understands what to expect with his PSA measures. Patient was also educated today about some of the long-term effects from radiation including a small risk for rectal bleeding and possibly erectile dysfunction. We talked about some of the general management approaches to these potential complications. We also discussed increasing the Rapalfo dose to BID to see if this gives better sustained control of the LUTS short term while we are waiting for the inflammation to settle down over the next 2-3 months. I advised him to call to let us know if the increased dose is helpful and I am happy to send a Rx for the BID dosing. I did encourage the patient to contact our office or return at any point if he has questions or concerns related to his previous radiation and prostate cancer.  Today, a comprehensive survivorship care plan and treatment summary was reviewed with the patient today detailing his prostate cancer diagnosis, treatment course, potential late/long-term effects of treatment, appropriate follow-up care with recommendations for the future, and patient education resources.  A copy of this summary, along with a letter will be sent to the patient's primary care provider via mail/fax/In Basket message after today's visit.  2. Cancer screening:  Due to Thomas Watson history and his age, he should receive screening for skin cancers, colon cancer, and lung cancer.  The information and recommendations are listed on the patient's comprehensive care plan/treatment summary and were reviewed in  detail with the patient.     3. Health maintenance and wellness promotion: Thomas Watson was encouraged to consume 5-7 servings of fruits and vegetables per day. He was provided a copy of the "Nutrition Rainbow" handout, as well as the handout "Take Control of Your Health and [place]" from the [place].  He was also encouraged to engage in moderate to vigorous exercise for 30 minutes per day most days of the week. Information was provided regarding the [facility] fitness program, which is designed for cancer survivors to help them become more physically fit after cancer treatments. We discussed that a healthy BMI is 18.5-24.9 and that maintaining a healthy weight reduces risk of cancer recurrences.  He was instructed to limit his alcohol consumption and was encouraged to stop smoking.  Lastly, he was encouraged to use sunscreen and wear protective clothing when in the sun.     4. Support services/counseling: It is not uncommon for this period of the patient's cancer care trajectory to be one of many emotions and stressors.  Mr. Monestime was encouraged to take advantage of our many support services programs, support groups, and/or counseling in coping with his new life as a cancer  survivor after completing anti-cancer treatment.  He was offered support today through active listening and expressive supportive counseling.  He was given information regarding our available services and encouraged to contact me with any questions or for help enrolling in any of our support group/programs.       Nicholos Johns, PA-C

## 2020-01-09 NOTE — Progress Notes (Signed)
  Radiation Oncology         [phone]) [phone] ________________________________  Name: Thomas Watson MRN: [id number]  Date: 01/06/2020  DOB: [birth year]  COMPLEX SIMULATION NOTE  NARRATIVE:  The patient was brought to the [facility] today following prostate seed implantation approximately one month ago.  Identity was confirmed.  All relevant records and images related to the planned course of therapy were reviewed.  Then, the patient was set-up supine.  CT images were obtained.  The CT images were loaded into the planning software.  Then the prostate and rectum were contoured.  Treatment planning then occurred.  The implanted iodine 125 seeds were identified by the physics staff for projection of radiation distribution  I have requested : 3D Simulation  I have requested a DVH of the following structures: Prostate and rectum.    ________________________________  Sheral Apley Tammi Klippel, M.D.

## 2020-01-19 ENCOUNTER — Other Ambulatory Visit: Payer: Self-pay | Admitting: Urology

## 2020-01-19 MED ORDER — SILODOSIN 8 MG PO CAPS: 8.0000 mg | ORAL_CAPSULE | Freq: Two times a day (BID) | ORAL | 2 refills | Status: DC

## 2020-01-20 ENCOUNTER — Telehealth: Payer: Self-pay | Admitting: *Deleted

## 2020-01-20 NOTE — Telephone Encounter (Signed)
Called patient to inform that script was sent to [facility] in [place], spoke with patient and he is aware of this

## 2020-02-15 ENCOUNTER — Encounter: Payer: Self-pay | Admitting: Radiation Oncology

## 2020-02-15 ENCOUNTER — Ambulatory Visit
Admission: RE | Admit: 2020-02-15 | Discharge: 2020-02-15 | Disposition: A | Discharge status: Home / Self Care | Payer: No Typology Code available for payment source | Source: Ambulatory Visit | Attending: Radiation Oncology | Admitting: Radiation Oncology

## 2020-02-15 DIAGNOSIS — C61 Malignant neoplasm of prostate: Secondary | ICD-10-CM | POA: Diagnosis not present

## 2020-03-17 ENCOUNTER — Encounter: Payer: Self-pay | Admitting: Plastic Surgery

## 2020-03-17 ENCOUNTER — Ambulatory Visit: Payer: No Typology Code available for payment source | Admitting: Plastic Surgery

## 2020-03-17 ENCOUNTER — Other Ambulatory Visit: Payer: Self-pay

## 2020-03-17 DIAGNOSIS — D171 Benign lipomatous neoplasm of skin and subcutaneous tissue of trunk: Secondary | ICD-10-CM

## 2020-03-17 NOTE — Progress Notes (Signed)
Patient ID: Thomas Watson, male    DOB: [birth year], 59 y.o.   MRN: [id number]   Chief Complaint  Patient presents with  . Skin Problem    The patient is a very pleasant 59 year old male here for evaluation of a mass on his back.  He has a 7 cm soft but nonmovable mass to the right of midline of his upper back.  The patient says its been there for years.  The primary care physician noticed that it was getting larger.  He has noticed some symptoms with tenderness and pressure.  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  No other symptoms.  He is otherwise in good health.  The record indicates he is a smoker.  No diabetes.  No sign of infection.   Review of Systems  Constitutional: Negative for activity change and appetite change.  Eyes: Negative.   Respiratory: Negative.  Negative for chest tightness and shortness of breath.   Gastrointestinal: Negative.  Negative for abdominal distention.  Endocrine: Negative.   Genitourinary: Negative.   Musculoskeletal: Positive for neck pain. Negative for back pain.  Hematological: Negative.   Psychiatric/Behavioral: Negative.     Past Medical History:  Diagnosis Date  . Prostate cancer [facility])     Past Surgical History:  Procedure Laterality Date  . PROSTATE BIOPSY    . RADIOACTIVE SEED IMPLANT N/A 12/06/2019   Procedure: RADIOACTIVE SEED IMPLANT/BRACHYTHERAPY IMPLANT;  Surgeon: Thomas Rhodes, MD;  Location: [facility];  Service: Urology;  Laterality: N/A;  . SPACE OAR INSTILLATION N/A 12/06/2019   Procedure: SPACE OAR INSTILLATION;  Surgeon: Thomas Rhodes, MD;  Location: [facility];  Service: Urology;  Laterality: N/A;      Current Outpatient Medications:  .  cholecalciferol (VITAMIN D3) 25 MCG (1000 UNIT) tablet, Take 1,000 Units by mouth daily., Disp: , Rfl:  .  Multiple Vitamin (MULTIVITAMIN) tablet, Take 1 tablet by mouth daily., Disp: , Rfl:  .  Saw  Palmetto 1000 MG CAPS, Take by mouth. Prostate pill prostate plus 1 tab per day, Disp: , Rfl:  .  silodosin (RAPAFLO) 8 MG CAPS capsule, SMARTSIG:1 Capsule(s) By Mouth Every Evening, Disp: , Rfl:  .  silodosin (RAPAFLO) 8 MG CAPS capsule, Take 1 capsule (8 mg total) by mouth in the morning and at bedtime. 30 minutes prior to breakfast and 30 minutes prior to evening meal, Disp: 60 capsule, Rfl: 2   Objective:   Vitals:   03/17/20 0954  BP: (!) 154/85  Pulse: 80  Temp: 98.6 F (37 C)  SpO2: 100%    Physical Exam Vitals and nursing note reviewed.  Constitutional:      Appearance: Normal appearance.  HENT:     Head: Normocephalic and atraumatic.  Cardiovascular:     Rate and Rhythm: Normal rate.     Pulses: Normal pulses.  Pulmonary:     Effort: Pulmonary effort is normal.  Abdominal:     General: Abdomen is flat. There is no distension.     Tenderness: There is no abdominal tenderness.  Musculoskeletal:       Arms:  Skin:    General: Skin is warm.     Capillary Refill: Capillary refill takes less than 2 seconds.  Neurological:     General: No focal deficit present.     Mental Status: He is alert and oriented to person, place, and time.  Psychiatric:  Mood and Affect: Mood normal.        Behavior: Behavior normal.        Thought Content: Thought content normal.     Assessment & Plan:  Lipoma of back  Recommend excision of back mass.  We will plan to send it to pathology.  Patient is in agreement for the excision and is aware that there is a small chance that it could recur. Pictures were obtained of the patient and placed in the chart with the patient's or guardian's permission.   [place], DO

## 2020-03-27 NOTE — Progress Notes (Signed)
  Radiation Oncology         [phone]) [phone] ________________________________  Name: Thomas Watson MRN: [id number]  Date: 02/15/2020  DOB: [birth year]  3D Planning Note   Prostate Brachytherapy Post-Implant Dosimetry  Diagnosis: 59 y.o. gentleman with Stage T1c adenocarcinoma of the prostate with Gleason score of 4+3, and PSA of 6.11.  Narrative: On a previous date, Thomas Watson returned following prostate seed implantation for post implant planning. He underwent CT scan complex simulation to delineate the three-dimensional structures of the pelvis and demonstrate the radiation distribution.  Since that time, the seed localization, and complex isodose planning with dose volume histograms have now been completed.  Results:   Prostate Coverage - The dose of radiation delivered to the 90% or more of the prostate gland (D90) was 126.9% of the prescription dose. This exceeds our goal of greater than 90%. Rectal Sparing - The volume of rectal tissue receiving the prescription dose or higher was 0.03 cc. This falls under our thresholds tolerance of 1.0 cc.  Impression: The prostate seed implant appears to show adequate target coverage and appropriate rectal sparing.  Plan:  The patient will continue to follow with urology for ongoing PSA determinations. I would anticipate a high likelihood for local tumor control with minimal risk for rectal morbidity.  ________________________________  Sheral Apley Tammi Klippel, M.D.

## 2020-04-15 ENCOUNTER — Ambulatory Visit: Payer: No Typology Code available for payment source | Attending: Internal Medicine

## 2020-04-15 DIAGNOSIS — Z23 Encounter for immunization: Secondary | ICD-10-CM

## 2020-04-15 NOTE — Progress Notes (Signed)
   Covid-19 Vaccination Clinic  Name:  Rory Xiang    MRN: [id number] DOB: July 07, 1960  04/15/2020  Mr. Devera was observed post Covid-19 immunization for 15 minutes without incident. He was provided with Vaccine Information Sheet and instruction to access the V-Safe system.   Mr. Tuccillo was instructed to call 911 with any severe reactions post vaccine: Marland Kitchen Difficulty breathing  . Swelling of face and throat  . A fast heartbeat  . A bad rash all over body  . Dizziness and weakness

## 2020-04-26 NOTE — H&P (View-Only) (Signed)
ICD-10-CM   1. Lipoma of back  D17.1       Patient ID: Thomas Watson, male    DOB: Oct 21, 1960, 59 y.o.   MRN: [id number]   History of Present Illness: Thomas Watson is a 59 y.o.  male  with a history of a back mass.  He presents for preoperative evaluation for upcoming procedure, excision of back lipoma, scheduled for 05/15/2020 with Dr. Marla Roe.  Summary from previous visit: Patient has a 7 cm soft nonmovable mass to the right of midline of his upper back.  Reports it has been there for several years and PCP noticed it was getting larger.  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  Job: Building control surveyor  PMH Significant for: Hx prostate cancer  The patient has not had problems with anesthesia.   Past Medical History: Allergies: Allergies  Allergen Reactions  . Ibuprofen Other (See Comments)    Intolerance. Causes GI upset.had stomach ulcers    Current Medications:  Current Outpatient Medications:  .  cephALEXin (KEFLEX) 500 MG capsule, Take 1 capsule (500 mg total) by mouth 4 (four) times daily for 3 days. For use AFTER Surgery, Disp: 12 capsule, Rfl: 0 .  cholecalciferol (VITAMIN D3) 25 MCG (1000 UNIT) tablet, Take 1,000 Units by mouth daily., Disp: , Rfl:  .  HYDROcodone-acetaminophen (NORCO) 5-325 MG tablet, Take 1 tablet by mouth every 8 (eight) hours as needed for up to 5 days for severe pain. For use AFTER Surgery, Disp: 21 tablet, Rfl: 0 .  Multiple Vitamin (MULTIVITAMIN) tablet, Take 1 tablet by mouth daily., Disp: , Rfl:  .  ondansetron (ZOFRAN) 4 MG tablet, Take 1 tablet (4 mg total) by mouth every 8 (eight) hours as needed for nausea or vomiting., Disp: 20 tablet, Rfl: 0 .  Saw Palmetto 1000 MG CAPS, Take by mouth. Prostate pill prostate plus 1 tab per day, Disp: , Rfl:  .  silodosin (RAPAFLO) 8 MG CAPS capsule, SMARTSIG:1 Capsule(s) By Mouth Every Evening, Disp: , Rfl:  .  silodosin  (RAPAFLO) 8 MG CAPS capsule, Take 1 capsule (8 mg total) by mouth in the morning and at bedtime. 30 minutes prior to breakfast and 30 minutes prior to evening meal, Disp: 60 capsule, Rfl: 2  Past Medical Problems: Past Medical History:  Diagnosis Date  . Prostate cancer [facility])     Past Surgical History: Past Surgical History:  Procedure Laterality Date  . PROSTATE BIOPSY    . RADIOACTIVE SEED IMPLANT N/A 12/06/2019   Procedure: RADIOACTIVE SEED IMPLANT/BRACHYTHERAPY IMPLANT;  Surgeon: [name], MD;  Location: [facility];  Service: Urology;  Laterality: N/A;  . SPACE OAR INSTILLATION N/A 12/06/2019   Procedure: SPACE OAR INSTILLATION;  Surgeon: [name], MD;  Location: [facility];  Service: Urology;  Laterality: N/A;    Social History: Social History   Socioeconomic History  . Marital status: Married    Spouse name: Caren Griffins   . Number of children: 3  . Years of education: Not on file  . Highest education level: Not on file  Occupational History    Comment: full time  Tobacco Use  . Smoking status: Light Tobacco Smoker    Packs/day: 0.25    Years: 30.00    Pack years: 7.50    Types: Cigarettes  . Smokeless tobacco: Never Used  Vaping Use  . Vaping Use: Never used  Substance and Sexual Activity  . Alcohol use: Yes    Comment: 2 beers per day  . Drug use: Yes    Types: Marijuana    Comment: marijuana last used 11-25-2019  . Sexual activity: Not Currently  Other Topics Concern  . Not on file  Social History Narrative   Patient and current wife don't have any children together. Patient has three children from a prior relationship.   Social Determinants of Health   Financial Resource Strain:   . Difficulty of Paying Living Expenses: Not on file  Food Insecurity:   . Worried About Charity fundraiser in the Last Year: Not on file  . Ran Out of Food in the Last Year: Not on file  Transportation Needs:   . Lack of Transportation  (Medical): Not on file  . Lack of Transportation (Non-Medical): Not on file  Physical Activity:   . Days of Exercise per Week: Not on file  . Minutes of Exercise per Session: Not on file  Stress:   . Feeling of Stress : Not on file  Social Connections:   . Frequency of Communication with Friends and Family: Not on file  . Frequency of Social Gatherings with Friends and Family: Not on file  . Attends Religious Services: Not on file  . Active Member of Clubs or Organizations: Not on file  . Attends Archivist Meetings: Not on file  . Marital Status: Not on file  Intimate Partner Violence:   . Fear of Current or Ex-Partner: Not on file  . Emotionally Abused: Not on file  . Physically Abused: Not on file  . Sexually Abused: Not on file    Family History: Family History  Problem Relation Age of Onset  . Colon cancer Paternal Uncle   . Bladder Cancer Paternal Grandmother   . Breast cancer Neg Hx   . Pancreatic cancer Neg Hx     Review of Systems: Review of Systems  Constitutional: Negative for chills and fever.  HENT: Negative for congestion and sore throat.   Respiratory: Negative for cough and shortness of breath.   Cardiovascular: Negative for chest pain.  Gastrointestinal: Negative for abdominal pain, nausea and vomiting.  Skin: Negative for itching and rash.       Lump on upper back    Physical Exam: Vital Signs BP (!) 146/95 (BP Location: Right Arm, Patient Position: Sitting, Cuff Size: Normal)   Pulse 75   Temp 98.1 F (36.7 C) (Other (Comment))   SpO2 100%  Physical Exam Vitals and nursing note reviewed.  Constitutional:      General: He is not in acute distress.    Appearance: Normal appearance. He is normal weight. He is not ill-appearing.  HENT:     Head: Normocephalic and atraumatic.  Eyes:     Extraocular Movements: Extraocular movements intact.  Neck:   Cardiovascular:     Rate and Rhythm: Normal rate and regular rhythm.  Pulmonary:      Effort: Pulmonary effort is normal.     Breath sounds: Normal breath sounds. No stridor. No wheezing, rhonchi or rales.  Abdominal:     General: Bowel sounds are normal.     Palpations: Abdomen is soft.  Musculoskeletal:        General: Normal range of motion.     Cervical back: Normal range of motion.  Skin:    General: Skin is warm and dry.  Neurological:     General: No focal deficit present.  Mental Status: He is alert and oriented to person, place, and time.  Psychiatric:        Mood and Affect: Mood normal.        Behavior: Behavior normal.        Thought Content: Thought content normal.        Judgment: Judgment normal.     Assessment/Plan:  Mr. Basilio scheduled for excision of back lipoma with Dr. Marla Roe.  Risks, benefits, and alternatives of procedure discussed, questions answered and consent obtained.    Smoking Status: occasional; Counseling Given? yes  Caprini Score: High; Risk Factors include: 59 year old male, Hx prostate cancer, possible family (fater) hx of blood clots, BMI < 25, and length of planned surgery. Recommendation for mechanical and pharmacological prophylaxis during surgery. Encourage early ambulation.   Pictures obtained: 03/17/2020  Post-op Rx sent to pharmacy: norco, zofran, keflex  Patient was provided with the General Surgical Risk consent document and Pain Medication Agreement prior to their appointment.  They had adequate time to read through the risk consent documents and Pain Medication Agreement. We also discussed them in person together during this preop appointment. All of their questions were answered to their satisfaction.  Recommended calling if they have any further questions.  Risk consent form and Pain Medication Agreement to be scanned into patient's chart.    Electronically signed by: Threasa Heads, PA-C 04/30/2020 8:29 PM

## 2020-04-26 NOTE — Progress Notes (Signed)
ICD-10-CM   1. Lipoma of back  D17.1       Patient ID: Thomas Watson, male    DOB: 06/22/1961, 59 y.o.   MRN: [id number]   History of Present Illness: Thomas Watson is a 59 y.o.  male  with a history of a back mass.  He presents for preoperative evaluation for upcoming procedure, excision of back lipoma, scheduled for 05/15/2020 with Dr. Marla Roe.  Summary from previous visit: Patient has a 7 cm soft nonmovable mass to the right of midline of his upper back.  Reports it has been there for several years and PCP noticed it was getting larger.  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  Job: Building control surveyor  PMH Significant for: Hx prostate cancer  The patient has not had problems with anesthesia.   Past Medical History: Allergies: Allergies  Allergen Reactions  . Ibuprofen Other (See Comments)    Intolerance. Causes GI upset.had stomach ulcers    Current Medications:  Current Outpatient Medications:  .  cephALEXin (KEFLEX) 500 MG capsule, Take 1 capsule (500 mg total) by mouth 4 (four) times daily for 3 days. For use AFTER Surgery, Disp: 12 capsule, Rfl: 0 .  cholecalciferol (VITAMIN D3) 25 MCG (1000 UNIT) tablet, Take 1,000 Units by mouth daily., Disp: , Rfl:  .  HYDROcodone-acetaminophen (NORCO) 5-325 MG tablet, Take 1 tablet by mouth every 8 (eight) hours as needed for up to 5 days for severe pain. For use AFTER Surgery, Disp: 21 tablet, Rfl: 0 .  Multiple Vitamin (MULTIVITAMIN) tablet, Take 1 tablet by mouth daily., Disp: , Rfl:  .  ondansetron (ZOFRAN) 4 MG tablet, Take 1 tablet (4 mg total) by mouth every 8 (eight) hours as needed for nausea or vomiting., Disp: 20 tablet, Rfl: 0 .  Saw Palmetto 1000 MG CAPS, Take by mouth. Prostate pill prostate plus 1 tab per day, Disp: , Rfl:  .  silodosin (RAPAFLO) 8 MG CAPS capsule, SMARTSIG:1 Capsule(s) By Mouth Every Evening, Disp: , Rfl:  .  silodosin  (RAPAFLO) 8 MG CAPS capsule, Take 1 capsule (8 mg total) by mouth in the morning and at bedtime. 30 minutes prior to breakfast and 30 minutes prior to evening meal, Disp: 60 capsule, Rfl: 2  Past Medical Problems: Past Medical History:  Diagnosis Date  . Prostate cancer [facility])     Past Surgical History: Past Surgical History:  Procedure Laterality Date  . PROSTATE BIOPSY    . RADIOACTIVE SEED IMPLANT N/A 12/06/2019   Procedure: RADIOACTIVE SEED IMPLANT/BRACHYTHERAPY IMPLANT;  Surgeon: [name], MD;  [facility];  Service: Urology;  Laterality: N/A;  . SPACE OAR INSTILLATION N/A 12/06/2019   Procedure: SPACE OAR INSTILLATION;  Surgeon: [name], MD;  Location: [facility];  Service: Urology;  Laterality: N/A;    Social History: Social History   Socioeconomic History  . Marital status: Married    Spouse name: Caren Griffins   . Number of children: 3  . Years of education: Not on file  . Highest education level: Not on file  Occupational History    Comment: full time  Tobacco Use  . Smoking status: Light Tobacco Smoker    Packs/day: 0.25    Years: 30.00    Pack years: 7.50    Types: Cigarettes  . Smokeless tobacco: Never Used  Vaping Use  . Vaping Use: Never used  Substance and Sexual Activity  . Alcohol use: Yes    Comment: 2 beers per day  . Drug use: Yes    Types: Marijuana    Comment: marijuana last used 11-25-2019  . Sexual activity: Not Currently  Other Topics Concern  . Not on file  Social History Narrative   Patient and current wife don't have any children together. Patient has three children from a prior relationship.   Social Determinants of Health   Financial Resource Strain:   . Difficulty of Paying Living Expenses: Not on file  Food Insecurity:   . Worried About Charity fundraiser in the Last Year: Not on file  . Ran Out of Food in the Last Year: Not on file  Transportation Needs:   . Lack of Transportation  (Medical): Not on file  . Lack of Transportation (Non-Medical): Not on file  Physical Activity:   . Days of Exercise per Week: Not on file  . Minutes of Exercise per Session: Not on file  Stress:   . Feeling of Stress : Not on file  Social Connections:   . Frequency of Communication with Friends and Family: Not on file  . Frequency of Social Gatherings with Friends and Family: Not on file  . Attends Religious Services: Not on file  . Active Member of Clubs or Organizations: Not on file  . Attends Archivist Meetings: Not on file  . Marital Status: Not on file  Intimate Partner Violence:   . Fear of Current or Ex-Partner: Not on file  . Emotionally Abused: Not on file  . Physically Abused: Not on file  . Sexually Abused: Not on file    Family History: Family History  Problem Relation Age of Onset  . Colon cancer Paternal Uncle   . Bladder Cancer Paternal Grandmother   . Breast cancer Neg Hx   . Pancreatic cancer Neg Hx     Review of Systems: Review of Systems  Constitutional: Negative for chills and fever.  HENT: Negative for congestion and sore throat.   Respiratory: Negative for cough and shortness of breath.   Cardiovascular: Negative for chest pain.  Gastrointestinal: Negative for abdominal pain, nausea and vomiting.  Skin: Negative for itching and rash.       Lump on upper back    Physical Exam: Vital Signs BP (!) 146/95 (BP Location: Right Arm, Patient Position: Sitting, Cuff Size: Normal)   Pulse 75   Temp 98.1 F (36.7 C) (Other (Comment))   SpO2 100%  Physical Exam Vitals and nursing note reviewed.  Constitutional:      General: He is not in acute distress.    Appearance: Normal appearance. He is normal weight. He is not ill-appearing.  HENT:     Head: Normocephalic and atraumatic.  Eyes:     Extraocular Movements: Extraocular movements intact.  Neck:   Cardiovascular:     Rate and Rhythm: Normal rate and regular rhythm.  Pulmonary:      Effort: Pulmonary effort is normal.     Breath sounds: Normal breath sounds. No stridor. No wheezing, rhonchi or rales.  Abdominal:     General: Bowel sounds are normal.     Palpations: Abdomen is soft.  Musculoskeletal:        General: Normal range of motion.     Cervical back: Normal range of motion.  Skin:    General: Skin is warm and dry.  Neurological:     General: No focal deficit present.  Mental Status: He is alert and oriented to person, place, and time.  Psychiatric:        Mood and Affect: Mood normal.        Behavior: Behavior normal.        Thought Content: Thought content normal.        Judgment: Judgment normal.     Assessment/Plan:  Mr. Sennett scheduled for excision of back lipoma with Dr. Marla Roe.  Risks, benefits, and alternatives of procedure discussed, questions answered and consent obtained.    Smoking Status: occasional; Counseling Given? yes  Caprini Score: High; Risk Factors include: 59 year old male, Hx prostate cancer, possible family (fater) hx of blood clots, BMI < 25, and length of planned surgery. Recommendation for mechanical and pharmacological prophylaxis during surgery. Encourage early ambulation.   Pictures obtained: 03/17/2020  Post-op Rx sent to pharmacy: norco, zofran, keflex  Patient was provided with the General Surgical Risk consent document and Pain Medication Agreement prior to their appointment.  They had adequate time to read through the risk consent documents and Pain Medication Agreement. We also discussed them in person together during this preop appointment. All of their questions were answered to their satisfaction.  Recommended calling if they have any further questions.  Risk consent form and Pain Medication Agreement to be scanned into patient's chart.    Electronically signed by: Threasa Heads, PA-C 04/30/2020 8:29 PM

## 2020-04-28 ENCOUNTER — Encounter: Payer: No Typology Code available for payment source | Admitting: Surgical

## 2020-04-28 ENCOUNTER — Other Ambulatory Visit: Payer: Self-pay

## 2020-04-28 ENCOUNTER — Ambulatory Visit (INDEPENDENT_AMBULATORY_CARE_PROVIDER_SITE_OTHER): Payer: No Typology Code available for payment source | Admitting: Plastic Surgery

## 2020-04-28 VITALS — BP 146/95 | HR 75 | Temp 98.1°F

## 2020-04-28 DIAGNOSIS — D171 Benign lipomatous neoplasm of skin and subcutaneous tissue of trunk: Secondary | ICD-10-CM

## 2020-04-28 MED ORDER — HYDROCODONE-ACETAMINOPHEN 5-325 MG PO TABS: 1.0000 | ORAL_TABLET | Freq: Three times a day (TID) | ORAL | 0 refills | Status: AC | PRN

## 2020-04-28 MED ORDER — CEPHALEXIN 500 MG PO CAPS
500.0000 mg | ORAL_CAPSULE | Freq: Four times a day (QID) | ORAL | 0 refills | Status: DC
Start: 2020-04-28 — End: 2020-05-01

## 2020-04-28 MED ORDER — ONDANSETRON HCL 4 MG PO TABS: 4.0000 mg | ORAL_TABLET | Freq: Three times a day (TID) | ORAL | 0 refills | Status: DC | PRN

## 2020-04-30 ENCOUNTER — Encounter: Payer: Self-pay | Admitting: Plastic Surgery

## 2020-05-01 ENCOUNTER — Ambulatory Visit (INDEPENDENT_AMBULATORY_CARE_PROVIDER_SITE_OTHER): Payer: No Typology Code available for payment source | Admitting: Family Medicine

## 2020-05-01 ENCOUNTER — Encounter: Payer: Self-pay | Admitting: Family Medicine

## 2020-05-01 ENCOUNTER — Other Ambulatory Visit: Payer: Self-pay

## 2020-05-01 VITALS — BP 139/80 | HR 74 | Temp 97.7°F | Resp 20 | Ht 68.0 in | Wt 154.0 lb

## 2020-05-01 DIAGNOSIS — N400 Enlarged prostate without lower urinary tract symptoms: Secondary | ICD-10-CM | POA: Diagnosis not present

## 2020-05-01 DIAGNOSIS — D126 Benign neoplasm of colon, unspecified: Secondary | ICD-10-CM

## 2020-05-01 DIAGNOSIS — Z720 Tobacco use: Secondary | ICD-10-CM | POA: Diagnosis not present

## 2020-05-01 DIAGNOSIS — Z0001 Encounter for general adult medical examination with abnormal findings: Secondary | ICD-10-CM

## 2020-05-01 DIAGNOSIS — C61 Malignant neoplasm of prostate: Secondary | ICD-10-CM

## 2020-05-01 DIAGNOSIS — Z23 Encounter for immunization: Secondary | ICD-10-CM

## 2020-05-01 DIAGNOSIS — Z0000 Encounter for general adult medical examination without abnormal findings: Secondary | ICD-10-CM

## 2020-05-01 DIAGNOSIS — D171 Benign lipomatous neoplasm of skin and subcutaneous tissue of trunk: Secondary | ICD-10-CM

## 2020-05-01 DIAGNOSIS — Z114 Encounter for screening for human immunodeficiency virus [HIV]: Secondary | ICD-10-CM

## 2020-05-01 NOTE — Progress Notes (Signed)
Subjective:  Patient ID: Thomas Watson, male    DOB: [birth year]  Age: 59 y.o. MRN: [id number]  CC: Annual Exam   HPI Sarp Vernier presents for CPE. Recently treated for Ca prostate with seed implants. He is also due for colonosscopy as a three year follow up from precancerous adenoma. His most recent PSA done about 6 weeks ago on September 16 was 0.81.  Depression screen [facility] 2/9 05/01/2020 04/29/2019 02/17/2018  Decreased Interest 0 0 0  Down, Depressed, Hopeless 0 0 0  PHQ - 2 Score 0 0 0    History Izaah has a past medical history of Prostate cancer ([place]).   He has a past surgical history that includes Prostate biopsy; Radioactive seed implant (N/A, 12/06/2019); and SPACE OAR INSTILLATION (N/A, 12/06/2019).   His family history includes Bladder Cancer in his paternal grandmother; Colon cancer in his paternal uncle.He reports that he has been smoking cigarettes. He has a 7.50 pack-year smoking history. He has never used smokeless tobacco. He reports current alcohol use. He reports current drug use. Drug: Marijuana.    ROS Review of Systems  Constitutional: Negative for activity change, fatigue and unexpected weight change.  HENT: Negative for congestion, ear pain, hearing loss, postnasal drip and trouble swallowing.   Eyes: Negative for pain and visual disturbance.  Respiratory: Negative for cough, chest tightness and shortness of breath.   Cardiovascular: Negative for chest pain, palpitations and leg swelling.  Gastrointestinal: Negative for abdominal distention, abdominal pain, blood in stool, constipation, diarrhea, nausea and vomiting.  Endocrine: Negative for cold intolerance, heat intolerance and polydipsia.  Genitourinary: Negative for difficulty urinating, dysuria, flank pain, frequency and urgency.       Reports retrograde  ejaculation  Musculoskeletal: Negative for arthralgias and joint swelling.  Skin: Negative for color change, rash and wound.  Neurological:  Negative for dizziness, syncope, speech difficulty, weakness, light-headedness, numbness and headaches.  Hematological: Does not bruise/bleed easily.  Psychiatric/Behavioral: Negative for confusion, decreased concentration, dysphoric mood and sleep disturbance. The patient is not nervous/anxious.     Objective:  BP 139/80   Pulse 74   Temp 97.7 F (36.5 C) (Temporal)   Resp 20   Ht 5' 8"  (1.727 m)   Wt 154 lb (69.9 kg)   SpO2 99%   BMI 23.42 kg/m   BP Readings from Last 3 Encounters:  05/01/20 139/80  04/30/20 (!) 146/95  03/17/20 (!) 154/85    Wt Readings from Last 3 Encounters:  05/01/20 154 lb (69.9 kg)  03/17/20 155 lb (70.3 kg)  01/06/20 146 lb 9.6 oz (66.5 kg)     Physical Exam Constitutional:      Appearance: He is well-developed.  HENT:     Head: Normocephalic and atraumatic.  Eyes:     Pupils: Pupils are equal, round, and reactive to light.  Neck:     Thyroid: No thyromegaly.     Trachea: No tracheal deviation.  Cardiovascular:     Rate and Rhythm: Normal rate and regular rhythm.     Heart sounds: Normal heart sounds. No murmur heard.  No friction rub. No gallop.   Pulmonary:     Breath sounds: Normal breath sounds. No wheezing or rales.  Abdominal:     General: Bowel sounds are normal. There is no distension.     Palpations: Abdomen is soft. There is no mass.     Tenderness: There is no abdominal tenderness.     Hernia: There is no hernia in the left inguinal  area.  Genitourinary:    Penis: Normal.      Testes: Normal.  Musculoskeletal:        General: Normal range of motion.     Cervical back: Normal range of motion.  Lymphadenopathy:     Cervical: No cervical adenopathy.  Skin:    General: Skin is warm and dry.  Neurological:     Mental Status: He is alert and oriented to person, place, and time.       Assessment & Plan:   Dee was seen today for annual exam.  Diagnoses and all orders for this visit:  Healthcare maintenance -      HIV Antibody (routine testing w rflx) -     Ambulatory referral to Gastroenterology -     CBC with Differential/Platelet -     CMP14+EGFR -     Lipid panel -     Urinalysis  Tobacco abuse  Benign prostatic hyperplasia, unspecified whether lower urinary tract symptoms present -     CBC with Differential/Platelet -     CMP14+EGFR  Colon adenoma -     Ambulatory referral to Gastroenterology  Malignant neoplasm of prostate ([place]) -     CBC with Differential/Platelet -     CMP14+EGFR  Lipoma of back  Encounter for screening for HIV -     HIV Antibody (routine testing w rflx)  Need for immunization against influenza -     Flu Vaccine QUAD 36+ mos IM       I have discontinued Cadden Van's cephALEXin. I am also having him maintain his multivitamin, Saw Palmetto, cholecalciferol, silodosin, HYDROcodone-acetaminophen, and ondansetron.  Allergies as of 05/01/2020      Reactions   Ibuprofen Other (See Comments)   Intolerance. Causes GI upset.had stomach ulcers      Medication List       Accurate as of May 01, 2020 10:06 PM. If you have any questions, ask your nurse or doctor.        STOP taking these medications   cephALEXin 500 MG capsule Commonly known as: KEFLEX Stopped by: [name], MD     TAKE these medications   cholecalciferol 25 MCG (1000 UNIT) tablet Commonly known as: VITAMIN D3 Take 1,000 Units by mouth daily.   HYDROcodone-acetaminophen 5-325 MG tablet Commonly known as: Norco Take 1 tablet by mouth every 8 (eight) hours as needed for up to 5 days for severe pain. For use AFTER Surgery   multivitamin tablet Take 1 tablet by mouth daily.   ondansetron 4 MG tablet Commonly known as: Zofran Take 1 tablet (4 mg total) by mouth every 8 (eight) hours as needed for nausea or vomiting.   Saw Palmetto 1000 MG Caps Take by mouth. Prostate pill prostate plus 1 tab per day   silodosin 8 MG Caps capsule Commonly known as: RAPAFLO SMARTSIG:1  Capsule(s) By Mouth Every Evening What changed: Another medication with the same name was removed. Continue taking this medication, and follow the directions you see here. Changed by: [name], MD        Follow-up: Return in about 1 year (around 05/01/2021) for [facility] physical.  [name], M.D.

## 2020-05-02 LAB — CMP14+EGFR
ALT: 43 IU/L (ref 0–44)
AST: 29 IU/L (ref 0–40)
Albumin/Globulin Ratio: 1.8 (ref 1.2–2.2)
Albumin: 4.8 g/dL (ref 3.8–4.9)
Alkaline Phosphatase: 77 IU/L (ref 44–121)
BUN/Creatinine Ratio: 16 (ref 9–20)
BUN: 16 mg/dL (ref 6–24)
Bilirubin Total: 0.2 mg/dL (ref 0.0–1.2)
CO2: 22 mmol/L (ref 20–29)
Calcium: 9.5 mg/dL (ref 8.7–10.2)
Chloride: 106 mmol/L (ref 96–106)
Creatinine, Ser: 0.98 mg/dL (ref 0.76–1.27)
GFR calc Af Amer: 97 mL/min/{1.73_m2} (ref >59)
GFR calc non Af Amer: 84 mL/min/{1.73_m2} (ref >59)
Globulin, Total: 2.6 g/dL (ref 1.5–4.5)
Glucose: 88 mg/dL (ref 65–99)
Potassium: 4.9 mmol/L (ref 3.5–5.2)
Sodium: 141 mmol/L (ref 134–144)
Total Protein: 7.4 g/dL (ref 6.0–8.5)

## 2020-05-02 LAB — CBC WITH DIFFERENTIAL/PLATELET
Basophils Absolute: 0.1 10*3/uL (ref 0.0–0.2)
Basos: 1 %
EOS (ABSOLUTE): 0.2 10*3/uL (ref 0.0–0.4)
Eos: 3 %
Hematocrit: 49.2 % (ref 37.5–51.0)
Hemoglobin: 15.4 g/dL (ref 13.0–17.7)
Immature Grans (Abs): 0 10*3/uL (ref 0.0–0.1)
Immature Granulocytes: 0 %
Lymphocytes Absolute: 2 10*3/uL (ref 0.7–3.1)
Lymphs: 32 %
MCH: 28.4 pg (ref 26.6–33.0)
MCHC: 31.3 g/dL — ABNORMAL LOW (ref 31.5–35.7)
MCV: 91 fL (ref 79–97)
Monocytes Absolute: 0.6 10*3/uL (ref 0.1–0.9)
Monocytes: 9 %
Neutrophils Absolute: 3.6 10*3/uL (ref 1.4–7.0)
Neutrophils: 55 %
Platelets: 311 10*3/uL (ref 150–450)
RBC: 5.43 x10E6/uL (ref 4.14–5.80)
RDW: 13.7 % (ref 11.6–15.4)
WBC: 6.4 10*3/uL (ref 3.4–10.8)

## 2020-05-02 LAB — LIPID PANEL
Chol/HDL Ratio: 3.1 ratio (ref 0.0–5.0)
Cholesterol, Total: 195 mg/dL (ref 100–199)
HDL: 63 mg/dL (ref >39)
LDL Chol Calc (NIH): 106 mg/dL — ABNORMAL HIGH (ref 0–99)
Triglycerides: 150 mg/dL — ABNORMAL HIGH (ref 0–149)
VLDL Cholesterol Cal: 26 mg/dL (ref 5–40)

## 2020-05-02 LAB — HIV ANTIBODY (ROUTINE TESTING W REFLEX): HIV Screen 4th Generation wRfx: NONREACTIVE

## 2020-05-02 NOTE — Progress Notes (Signed)
Hello Thomas Watson,  Your lab result is normal and/or stable.Some minor variations that are not significant are commonly marked abnormal, but do not represent any medical problem for you.  Best regards, [name], M.D.

## 2020-05-08 ENCOUNTER — Other Ambulatory Visit: Payer: Self-pay

## 2020-05-08 ENCOUNTER — Encounter (HOSPITAL_BASED_OUTPATIENT_CLINIC_OR_DEPARTMENT_OTHER): Payer: Self-pay | Admitting: Plastic Surgery

## 2020-05-12 ENCOUNTER — Other Ambulatory Visit (HOSPITAL_COMMUNITY)
Admission: RE | Admit: 2020-05-12 | Discharge: 2020-05-12 | Disposition: A | Discharge status: Home / Self Care | Payer: No Typology Code available for payment source | Source: Ambulatory Visit | Attending: Plastic Surgery | Admitting: Plastic Surgery

## 2020-05-12 DIAGNOSIS — Z20822 Contact with and (suspected) exposure to covid-19: Secondary | ICD-10-CM | POA: Diagnosis not present

## 2020-05-12 DIAGNOSIS — Z01812 Encounter for preprocedural laboratory examination: Secondary | ICD-10-CM | POA: Insufficient documentation

## 2020-05-12 LAB — SARS CORONAVIRUS 2 (TAT 6-24 HRS): SARS Coronavirus 2: NEGATIVE

## 2020-05-15 ENCOUNTER — Ambulatory Visit (HOSPITAL_BASED_OUTPATIENT_CLINIC_OR_DEPARTMENT_OTHER): Payer: No Typology Code available for payment source | Admitting: Certified Registered"

## 2020-05-15 ENCOUNTER — Ambulatory Visit (HOSPITAL_BASED_OUTPATIENT_CLINIC_OR_DEPARTMENT_OTHER)
Admission: RE | Admit: 2020-05-15 | Discharge: 2020-05-15 | Disposition: A | Discharge status: Home / Self Care | Payer: No Typology Code available for payment source | Source: Ambulatory Visit | Attending: Plastic Surgery | Admitting: Plastic Surgery

## 2020-05-15 ENCOUNTER — Encounter (HOSPITAL_BASED_OUTPATIENT_CLINIC_OR_DEPARTMENT_OTHER): Payer: Self-pay | Admitting: Plastic Surgery

## 2020-05-15 ENCOUNTER — Other Ambulatory Visit: Payer: Self-pay

## 2020-05-15 ENCOUNTER — Encounter (HOSPITAL_BASED_OUTPATIENT_CLINIC_OR_DEPARTMENT_OTHER)
Admission: RE | Disposition: A | Discharge status: Home / Self Care | Payer: Self-pay | Source: Ambulatory Visit | Attending: Plastic Surgery

## 2020-05-15 DIAGNOSIS — Z886 Allergy status to analgesic agent status: Secondary | ICD-10-CM | POA: Diagnosis not present

## 2020-05-15 DIAGNOSIS — D171 Benign lipomatous neoplasm of skin and subcutaneous tissue of trunk: Secondary | ICD-10-CM | POA: Insufficient documentation

## 2020-05-15 DIAGNOSIS — Z79899 Other long term (current) drug therapy: Secondary | ICD-10-CM | POA: Diagnosis not present

## 2020-05-15 HISTORY — PX: LIPOMA EXCISION: SHX5283

## 2020-05-15 SURGERY — EXCISION LIPOMA
Anesthesia: General | Site: Back

## 2020-05-15 MED ORDER — SUGAMMADEX SODIUM 200 MG/2ML IV SOLN
INTRAVENOUS | Status: DC | PRN
  Administered 2020-05-15: 300 mg via INTRAVENOUS

## 2020-05-15 MED ORDER — CEFAZOLIN SODIUM-DEXTROSE 2-4 GM/100ML-% IV SOLN: 2.0000 g | INTRAVENOUS | Status: DC

## 2020-05-15 MED ORDER — MIDAZOLAM HCL 5 MG/5ML IJ SOLN
INTRAMUSCULAR | Status: DC | PRN
  Administered 2020-05-15: 2 mg via INTRAVENOUS

## 2020-05-15 MED ORDER — PROPOFOL 10 MG/ML IV BOLUS
INTRAVENOUS | Status: DC | PRN
  Administered 2020-05-15: 160 mg via INTRAVENOUS

## 2020-05-15 MED ORDER — FENTANYL CITRATE (PF) 100 MCG/2ML IJ SOLN
INTRAMUSCULAR | Status: DC | PRN
  Administered 2020-05-15: 100 ug via INTRAVENOUS

## 2020-05-15 MED ORDER — OXYCODONE HCL 5 MG PO TABS: 5.0000 mg | ORAL_TABLET | ORAL | Status: DC | PRN

## 2020-05-15 MED ORDER — DEXAMETHASONE SODIUM PHOSPHATE 4 MG/ML IJ SOLN
INTRAMUSCULAR | Status: DC | PRN
  Administered 2020-05-15: 5 mg via INTRAVENOUS

## 2020-05-15 MED ORDER — MORPHINE SULFATE (PF) 4 MG/ML IV SOLN: 2.0000 mg | INTRAVENOUS | Status: DC | PRN

## 2020-05-15 MED ORDER — CHLORHEXIDINE GLUCONATE CLOTH 2 % EX PADS: 6.0000 | MEDICATED_PAD | Freq: Once | CUTANEOUS | Status: DC

## 2020-05-15 MED ORDER — PROPOFOL 10 MG/ML IV BOLUS
INTRAVENOUS | Status: AC
  Filled 2020-05-15: qty 20

## 2020-05-15 MED ORDER — ROCURONIUM BROMIDE 10 MG/ML (PF) SYRINGE
PREFILLED_SYRINGE | INTRAVENOUS | Status: AC
  Filled 2020-05-15: qty 10

## 2020-05-15 MED ORDER — ONDANSETRON HCL 4 MG/2ML IJ SOLN
INTRAMUSCULAR | Status: DC | PRN
  Administered 2020-05-15: 4 mg via INTRAVENOUS

## 2020-05-15 MED ORDER — SODIUM CHLORIDE 0.9% FLUSH: 3.0000 mL | Freq: Two times a day (BID) | INTRAVENOUS | Status: DC

## 2020-05-15 MED ORDER — SODIUM CHLORIDE 0.9% FLUSH: 3.0000 mL | INTRAVENOUS | Status: DC | PRN

## 2020-05-15 MED ORDER — FENTANYL CITRATE (PF) 100 MCG/2ML IJ SOLN
INTRAMUSCULAR | Status: AC
  Filled 2020-05-15: qty 2

## 2020-05-15 MED ORDER — ROCURONIUM BROMIDE 100 MG/10ML IV SOLN
INTRAVENOUS | Status: DC | PRN
  Administered 2020-05-15: 60 mg via INTRAVENOUS

## 2020-05-15 MED ORDER — LIDOCAINE HCL (CARDIAC) PF 100 MG/5ML IV SOSY
PREFILLED_SYRINGE | INTRAVENOUS | Status: DC | PRN
  Administered 2020-05-15: 60 mg via INTRATRACHEAL

## 2020-05-15 MED ORDER — ACETAMINOPHEN 325 MG PO TABS: 650.0000 mg | ORAL_TABLET | ORAL | Status: DC | PRN

## 2020-05-15 MED ORDER — MIDAZOLAM HCL 2 MG/2ML IJ SOLN
INTRAMUSCULAR | Status: AC
  Filled 2020-05-15: qty 2

## 2020-05-15 MED ORDER — ACETAMINOPHEN 325 MG RE SUPP: 650.0000 mg | RECTAL | Status: DC | PRN

## 2020-05-15 MED ORDER — DEXAMETHASONE SODIUM PHOSPHATE 10 MG/ML IJ SOLN
INTRAMUSCULAR | Status: AC
  Filled 2020-05-15: qty 1

## 2020-05-15 MED ORDER — LIDOCAINE-EPINEPHRINE 1 %-1:100000 IJ SOLN
INTRAMUSCULAR | Status: DC | PRN
  Administered 2020-05-15: 3.5 mL

## 2020-05-15 MED ORDER — CEFAZOLIN SODIUM-DEXTROSE 2-4 GM/100ML-% IV SOLN
INTRAVENOUS | Status: AC
  Filled 2020-05-15: qty 100

## 2020-05-15 MED ORDER — ONDANSETRON HCL 4 MG/2ML IJ SOLN
INTRAMUSCULAR | Status: AC
  Filled 2020-05-15: qty 2

## 2020-05-15 MED ORDER — SODIUM CHLORIDE 0.9 % IV SOLN: 250.0000 mL | INTRAVENOUS | Status: DC | PRN

## 2020-05-15 MED ORDER — LACTATED RINGERS IV SOLN: INTRAVENOUS | Status: DC

## 2020-05-15 SURGICAL SUPPLY — 45 items
BLADE CLIPPER SURG (BLADE) IMPLANT
BLADE HEX COATED 2.75 (ELECTRODE) IMPLANT
BLADE SURG 15 STRL LF DISP TIS (BLADE) ×1 IMPLANT
BLADE SURG 15 STRL SS (BLADE) ×2
CANISTER SUCT 1200ML W/VALVE (MISCELLANEOUS) ×3 IMPLANT
CLOSURE WOUND 1/2 X4 (GAUZE/BANDAGES/DRESSINGS) ×1
COVER BACK TABLE 60X90IN (DRAPES) ×3 IMPLANT
COVER MAYO STAND STRL (DRAPES) ×3 IMPLANT
COVER WAND RF STERILE (DRAPES) IMPLANT
DERMABOND ADVANCED (GAUZE/BANDAGES/DRESSINGS)
DERMABOND ADVANCED .7 DNX12 (GAUZE/BANDAGES/DRESSINGS) IMPLANT
DRAPE LAPAROTOMY 100X72 PEDS (DRAPES) ×3 IMPLANT
DRAPE U-SHAPE 76X120 STRL (DRAPES) IMPLANT
ELECT BLADE 4.0 EZ CLEAN MEGAD (MISCELLANEOUS) ×3
ELECT COATED BLADE 2.86 ST (ELECTRODE) ×3 IMPLANT
ELECT REM PT RETURN 9FT ADLT (ELECTROSURGICAL) ×3
ELECTRODE BLDE 4.0 EZ CLN MEGD (MISCELLANEOUS) ×1 IMPLANT
ELECTRODE REM PT RTRN 9FT ADLT (ELECTROSURGICAL) ×1 IMPLANT
GAUZE SPONGE 4X4 12PLY STRL LF (GAUZE/BANDAGES/DRESSINGS) IMPLANT
GLOVE BIO SURGEON STRL SZ 6.5 (GLOVE) ×6 IMPLANT
GLOVE BIO SURGEONS STRL SZ 6.5 (GLOVE) ×3
GOWN STRL REUS W/ TWL LRG LVL3 (GOWN DISPOSABLE) ×3 IMPLANT
GOWN STRL REUS W/TWL LRG LVL3 (GOWN DISPOSABLE) ×6
NEEDLE HYPO 25X1 1.5 SAFETY (NEEDLE) ×3 IMPLANT
NS IRRIG 1000ML POUR BTL (IV SOLUTION) ×3 IMPLANT
PACK BASIN DAY SURGERY FS (CUSTOM PROCEDURE TRAY) ×3 IMPLANT
PENCIL SMOKE EVACUATOR (MISCELLANEOUS) ×3 IMPLANT
SPONGE GAUZE 2X2 8PLY STER LF (GAUZE/BANDAGES/DRESSINGS)
SPONGE GAUZE 2X2 8PLY STRL LF (GAUZE/BANDAGES/DRESSINGS) IMPLANT
SPONGE SURGIFOAM ABS GEL 12-7 (HEMOSTASIS) ×3 IMPLANT
STRIP CLOSURE SKIN 1/2X4 (GAUZE/BANDAGES/DRESSINGS) ×2 IMPLANT
SUCTION FRAZIER HANDLE 10FR (MISCELLANEOUS)
SUCTION TUBE FRAZIER 10FR DISP (MISCELLANEOUS) IMPLANT
SUT ETHILON 4 0 PS 2 18 (SUTURE) IMPLANT
SUT MNCRL AB 4-0 PS2 18 (SUTURE) ×3 IMPLANT
SUT MON AB 3-0 SH 27 (SUTURE) ×2
SUT MON AB 3-0 SH27 (SUTURE) ×1 IMPLANT
SUT MON AB 5-0 PS2 18 (SUTURE) ×3 IMPLANT
SYR BULB EAR ULCER 3OZ GRN STR (SYRINGE) ×3 IMPLANT
SYR CONTROL 10ML LL (SYRINGE) ×3 IMPLANT
TOWEL GREEN STERILE FF (TOWEL DISPOSABLE) ×3 IMPLANT
TRAY DSU PREP LF (CUSTOM PROCEDURE TRAY) ×3 IMPLANT
TUBE CONNECTING 20'X1/4 (TUBING) ×1
TUBE CONNECTING 20X1/4 (TUBING) ×2 IMPLANT
YANKAUER SUCT BULB TIP NO VENT (SUCTIONS) ×3 IMPLANT

## 2020-05-15 NOTE — Discharge Instructions (Signed)
Keep head elevated for next 24 hours. Ice to area on and off every 20 minutes for the next 12 hours. May shower in two days.     Post Anesthesia Home Care Instructions  Activity: Get plenty of rest for the remainder of the day. A responsible individual must stay with you for 24 hours following the procedure.  For the next 24 hours, DO NOT: -Drive a car -Paediatric nurse -Drink alcoholic beverages -Take any medication unless instructed by your physician -Make any legal decisions or sign important papers.  Meals: Start with liquid foods such as gelatin or soup. Progress to regular foods as tolerated. Avoid greasy, spicy, heavy foods. If nausea and/or vomiting occur, drink only clear liquids until the nausea and/or vomiting subsides. Call your physician if vomiting continues.  Special Instructions/Symptoms: Your throat may feel dry or sore from the anesthesia or the breathing tube placed in your throat during surgery. If this causes discomfort, gargle with warm salt water. The discomfort should disappear within 24 hours.  If you had a scopolamine patch placed behind your ear for the management of post- operative nausea and/or vomiting:  1. The medication in the patch is effective for 72 hours, after which it should be removed.  Wrap patch in a tissue and discard in the trash. Wash hands thoroughly with soap and water. 2. You may remove the patch earlier than 72 hours if you experience unpleasant side effects which may include dry mouth, dizziness or visual disturbances. 3. Avoid touching the patch. Wash your hands with soap and water after contact with the patch.

## 2020-05-15 NOTE — Transfer of Care (Signed)
Immediate Anesthesia Transfer of Care Note  Patient: Emett Stapel  Procedure(s) Performed: EXCISION LIPOMA (N/A Back)  Patient Location: PACU  Anesthesia Type:General  Level of Consciousness: awake, alert  and oriented  Airway & Oxygen Therapy: Patient Spontanous Breathing and Patient connected to face mask oxygen  Post-op Assessment: Report given to RN and Post -op Vital signs reviewed and stable  Post vital signs: Reviewed and stable  Last Vitals:  Vitals Value Taken Time  BP 123/79 05/15/20 1055  Temp    Pulse 76 05/15/20 1056  Resp 15 05/15/20 1056  SpO2 100 % 05/15/20 1056  Vitals shown include unvalidated device data.  Last Pain:  Vitals:   05/15/20 0833  TempSrc:   PainSc: 0-No pain         Complications: No complications documented.

## 2020-05-15 NOTE — Anesthesia Postprocedure Evaluation (Signed)
Anesthesia Post Note  Patient: Armond Cuthrell  Procedure(s) Performed: EXCISION LIPOMA (N/A Back)     Patient location during evaluation: PACU Anesthesia Type: General Level of consciousness: awake and alert Pain management: pain level controlled Vital Signs Assessment: post-procedure vital signs reviewed and stable Respiratory status: spontaneous breathing, nonlabored ventilation, respiratory function stable and patient connected to nasal cannula oxygen Cardiovascular status: blood pressure returned to baseline and stable Postop Assessment: no apparent nausea or vomiting Anesthetic complications: no   No complications documented.  Last Vitals:  Vitals:   05/15/20 1115 05/15/20 1127  BP: (!) 128/96 (!) 149/98  Pulse: 75 81  Resp: 17 16  Temp:  36.6 C  SpO2: 100% 98%    Last Pain:  Vitals:   05/15/20 1127  TempSrc:   PainSc: 0-No pain                 Susie Pousson

## 2020-05-15 NOTE — Op Note (Signed)
DATE OF OPERATION: 05/15/2020  LOCATION: [facility]  PREOPERATIVE DIAGNOSIS: Lipoma of back  POSTOPERATIVE DIAGNOSIS: Same  PROCEDURE: Excision of 3.5 x 3.5 cm lipoma of back  SURGEON: Josaiah Muhammed Sanger Phiona Ramnauth, DO  ASSISTANT: Roetta Sessions, PA  EBL: 4 cc  CONDITION: Stable  COMPLICATIONS: None  INDICATION: The patient, Thomas Watson, is a 59 y.o. male born on 12/20/1960, is here for treatment of a back lipoma.   PROCEDURE DETAILS:  The patient was seen prior to surgery and marked.  The IV antibiotics were given. The patient was taken to the operating room and given a general anesthetic. A standard time out was performed and all information was confirmed by those in the room. SCDs were placed.   The patient was placed in the left lateral position.   Local was injected for intraoperative hemostasis.   The #10 blade was used to make an incision.  The hemostat was used to dissect around the lipoma that was benign in appearance.  The 3.5 x 3.5 cm lipoma was removed in parts to keep the incision as small as possible. Hemostasis was achieved with electrocautery.   Thrombin was placed to assure hemostasis due to the mobile location.  The deep layers were closed with 3-0 Monocryl.  The skin was closed with 4-0 Monocryl.  Derma bond and steri strips were applied with a sterile dressing.  The patient was allowed to wake up and taken to recovery room in stable condition at the end of the case. The family was notified at the end of the case.   The advanced practice practitioner (APP) assisted throughout the case.  The APP was essential in retraction and counter traction when needed to make the case progress smoothly.  This retraction and assistance made it possible to see the tissue plans for the procedure.  The assistance was needed for blood control, tissue re-approximation and assisted with closure of the incision site.

## 2020-05-15 NOTE — Interval H&P Note (Signed)
History and Physical Interval Note:  05/15/2020 9:47 AM  Thomas Watson  has presented today for surgery, with the diagnosis of lipoma of back.  The various methods of treatment have been discussed with the patient and family. After consideration of risks, benefits and other options for treatment, the patient has consented to  Procedure(s) with comments: EXCISION LIPOMA (N/A) - DEBBIE W/CCS APPROVED as a surgical intervention.  The patient's history has been reviewed, patient examined, no change in status, stable for surgery.  I have reviewed the patient's chart and labs.  Questions were answered to the patient's satisfaction.     Loel Lofty Keona Sheffler

## 2020-05-15 NOTE — Anesthesia Preprocedure Evaluation (Signed)
Anesthesia Evaluation  Patient identified by MRN, date of birth, ID band Patient awake    Reviewed: Allergy & Precautions, NPO status , Patient's Chart, lab work & pertinent test results  History of Anesthesia Complications Negative for: history of anesthetic complications  Airway Mallampati: III  TM Distance: >3 FB Neck ROM: Full    Dental  (+) Missing, Poor Dentition, Dental Advisory Given   Pulmonary neg shortness of breath, neg COPD, neg recent URI, Current Smoker and Patient abstained from smoking.,    breath sounds clear to auscultation       Cardiovascular negative cardio ROS   Rhythm:Regular     Neuro/Psych negative neurological ROS  negative psych ROS   GI/Hepatic negative GI ROS, Neg liver ROS,   Endo/Other  negative endocrine ROS  Renal/GU negative Renal ROS     Musculoskeletal negative musculoskeletal ROS (+)   Abdominal   Peds  Hematology negative hematology ROS (+)   Anesthesia Other Findings   Reproductive/Obstetrics                             Anesthesia Physical Anesthesia Plan  ASA: II  Anesthesia Plan: General   Post-op Pain Management:    Induction: Intravenous  PONV Risk Score and Plan: 1 and Ondansetron and Treatment may vary due to age or medical condition  Airway Management Planned: LMA and Oral ETT  Additional Equipment: None  Intra-op Plan:   Post-operative Plan: Extubation in OR  Informed Consent:     Dental advisory given  Plan Discussed with: CRNA and Surgeon  Anesthesia Plan Comments:         Anesthesia Quick Evaluation

## 2020-05-15 NOTE — Anesthesia Procedure Notes (Signed)
Procedure Name: Intubation Performed by: Verita Lamb, CRNA Pre-anesthesia Checklist: Patient identified, Emergency Drugs available, Suction available and Patient being monitored Patient Re-evaluated:Patient Re-evaluated prior to induction Oxygen Delivery Method: Circle system utilized Preoxygenation: Pre-oxygenation with 100% oxygen Induction Type: IV induction Ventilation: Mask ventilation without difficulty Laryngoscope Size: Mac and 4 Grade View: Grade I Tube type: Oral Tube size: 7.0 mm Number of attempts: 1 Airway Equipment and Method: Stylet and Oral airway Placement Confirmation: ETT inserted through vocal cords under direct vision,  positive ETCO2 and breath sounds checked- equal and bilateral Secured at: 24 cm Tube secured with: Tape Dental Injury: Teeth and Oropharynx as per pre-operative assessment  Comments: Very poor dentition, multiple decaying and loose teeth.  Very careful with intubation and teeth remain unchanged.mkelly

## 2020-05-16 LAB — SURGICAL PATHOLOGY

## 2020-05-17 ENCOUNTER — Encounter (HOSPITAL_BASED_OUTPATIENT_CLINIC_OR_DEPARTMENT_OTHER): Payer: Self-pay | Admitting: Plastic Surgery

## 2020-06-02 ENCOUNTER — Ambulatory Visit (INDEPENDENT_AMBULATORY_CARE_PROVIDER_SITE_OTHER): Payer: No Typology Code available for payment source | Admitting: Surgical

## 2020-06-02 ENCOUNTER — Encounter: Payer: Self-pay | Admitting: Surgical

## 2020-06-02 ENCOUNTER — Other Ambulatory Visit: Payer: Self-pay

## 2020-06-02 VITALS — BP 135/82 | HR 84 | Temp 98.7°F

## 2020-06-02 DIAGNOSIS — D171 Benign lipomatous neoplasm of skin and subcutaneous tissue of trunk: Secondary | ICD-10-CM

## 2020-06-02 NOTE — Progress Notes (Signed)
Patient is a 59 year old male here for follow-up after excision of a back lipoma with Dr. Marla Roe on 05/15/2020.  He is 2 weeks postop.  He reports that he is doing well.  He has no complaints.  He reports some itching.  He has been keeping it covered.  Pathology report reviewed with patient, lipoma.  On exam midline back incision intact, no dehiscence noted.  No overlying skin changes.  Nontender to palpation.  Mild swelling noted.  No erythema.  Recommend avoiding heavy lifting for 2 more weeks, keep the incision covered while at work for the next 2 weeks.  Can keep wound covered while at home. Cover the area with sunscreen when exposed to the sun to prevent discoloration. Recommend calling with any questions or concerns, follow-up as needed.

## 2020-11-17 ENCOUNTER — Other Ambulatory Visit: Payer: Self-pay | Admitting: Family Medicine

## 2021-05-02 ENCOUNTER — Ambulatory Visit: Payer: No Typology Code available for payment source | Admitting: Family Medicine

## 2021-05-11 ENCOUNTER — Ambulatory Visit: Payer: No Typology Code available for payment source | Admitting: Nurse Practitioner

## 2021-05-21 ENCOUNTER — Other Ambulatory Visit: Payer: Self-pay

## 2021-05-21 ENCOUNTER — Encounter: Payer: Self-pay | Admitting: Family Medicine

## 2021-05-21 ENCOUNTER — Ambulatory Visit (INDEPENDENT_AMBULATORY_CARE_PROVIDER_SITE_OTHER): Payer: No Typology Code available for payment source | Admitting: Family Medicine

## 2021-05-21 VITALS — BP 121/70 | HR 80 | Temp 97.8°F | Ht 68.0 in | Wt 158.6 lb

## 2021-05-21 DIAGNOSIS — N400 Enlarged prostate without lower urinary tract symptoms: Secondary | ICD-10-CM | POA: Diagnosis not present

## 2021-05-21 DIAGNOSIS — E559 Vitamin D deficiency, unspecified: Secondary | ICD-10-CM | POA: Diagnosis not present

## 2021-05-21 DIAGNOSIS — Z0001 Encounter for general adult medical examination with abnormal findings: Secondary | ICD-10-CM | POA: Diagnosis not present

## 2021-05-21 DIAGNOSIS — Z23 Encounter for immunization: Secondary | ICD-10-CM

## 2021-05-21 DIAGNOSIS — Z0000 Encounter for general adult medical examination without abnormal findings: Secondary | ICD-10-CM

## 2021-05-21 NOTE — Progress Notes (Signed)
Subjective:  Patient ID: Thomas Watson, male    DOB: Jun 17, 1961  Age: 60 y.o. MRN: [id number]  CC: Annual Exam   HPI Sharron Simpson presents for CPE. No complaints. Saw urology n September. Had a normal PSA. They changed his BPH med to alfuzosin  Depression screen [facility] 2/9 05/21/2021 05/01/2020 04/29/2019  Decreased Interest 0 0 0  Down, Depressed, Hopeless 0 0 0  PHQ - 2 Score 0 0 0    History Haig has a past medical history of Prostate cancer ([place]).   He has a past surgical history that includes Prostate biopsy; Radioactive seed implant (N/A, 12/06/2019); SPACE OAR INSTILLATION (N/A, 12/06/2019); and Lipoma excision (N/A, 05/15/2020).   His family history includes Bladder Cancer in his paternal grandmother; Colon cancer in his paternal uncle.He reports that he has been smoking cigarettes. He has a 7.50 pack-year smoking history. He has never used smokeless tobacco. He reports current alcohol use. He reports current drug use. Drug: Marijuana.    ROS Review of Systems  Constitutional:  Negative for activity change, fatigue and unexpected weight change.  HENT:  Negative for congestion, ear pain, hearing loss, postnasal drip and trouble swallowing.   Eyes:  Negative for pain and visual disturbance.  Respiratory:  Negative for cough, chest tightness and shortness of breath.   Cardiovascular:  Negative for chest pain, palpitations and leg swelling.  Gastrointestinal:  Negative for abdominal distention, abdominal pain, blood in stool, constipation, diarrhea, nausea and vomiting.  Endocrine: Negative for cold intolerance, heat intolerance and polydipsia.  Genitourinary:  Negative for difficulty urinating, dysuria, flank pain, frequency and urgency.  Musculoskeletal:  Negative for arthralgias and joint swelling.  Skin:  Negative for color change, rash and wound.  Neurological:  Negative for dizziness, syncope, speech difficulty, weakness, light-headedness, numbness and headaches.   Hematological:  Does not bruise/bleed easily.  Psychiatric/Behavioral:  Negative for confusion, decreased concentration, dysphoric mood and sleep disturbance. The patient is not nervous/anxious.    Objective:  BP 121/70   Pulse 80   Temp 97.8 F (36.6 C)   Ht _0  (1.727 m)   Wt 158 lb 9.6 oz (71.9 kg)   SpO2 99%   BMI 24.12 kg/m   BP Readings from Last 3 Encounters:  05/21/21 121/70  06/02/20 135/82  05/15/20 (!) 149/98    Wt Readings from Last 3 Encounters:  05/21/21 158 lb 9.6 oz (71.9 kg)  05/15/20 154 lb 1.6 oz (69.9 kg)  05/01/20 154 lb (69.9 kg)     Physical Exam Constitutional:      Appearance: He is well-developed.  HENT:     Head: Normocephalic and atraumatic.  Eyes:     Pupils: Pupils are equal, round, and reactive to light.  Neck:     Thyroid: No thyromegaly.     Trachea: No tracheal deviation.  Cardiovascular:     Rate and Rhythm: Normal rate and regular rhythm.     Heart sounds: Normal heart sounds. No murmur heard.   No friction rub. No gallop.  Pulmonary:     Breath sounds: Normal breath sounds. No wheezing or rales.  Abdominal:     General: Bowel sounds are normal. There is no distension.     Palpations: Abdomen is soft. There is no mass.     Tenderness: There is no abdominal tenderness.     Hernia: There is no hernia in the left inguinal area.  Genitourinary:    Penis: Normal.      Testes: Normal.  Musculoskeletal:  General: Normal range of motion.     Cervical back: Normal range of motion.  Lymphadenopathy:     Cervical: No cervical adenopathy.  Skin:    General: Skin is warm and dry.  Neurological:     Mental Status: He is alert and oriented to person, place, and time.      Assessment & Plan:   Zaryan was seen today for annual exam.  Diagnoses and all orders for this visit:  Physical exam, annual -     CBC with Differential/Platelet -     CMP14+EGFR -     Lipid panel -     Urinalysis  Need for immunization  against influenza -     Flu Vaccine QUAD 63moIM (Fluarix, Fluzone & Alfiuria Quad PF)  Benign prostatic hyperplasia, unspecified whether lower urinary tract symptoms present -     Urinalysis  Vitamin D deficiency -     VITAMIN D 25 Hydroxy (Vit-D Deficiency, Fractures)      I have discontinued MGezaJackson's silodosin and ondansetron. I am also having him maintain his multivitamin, Saw Palmetto, cholecalciferol, and alfuzosin.  Allergies as of 05/21/2021       Reactions   Ibuprofen Other (See Comments)   Intolerance. Causes GI upset.had stomach ulcers        Medication List        Accurate as of May 21, 2021  2:51 PM. If you have any questions, ask your nurse or doctor.          STOP taking these medications    ondansetron 4 MG tablet Commonly known as: Zofran Stopped by: WClaretta Fraise MD   silodosin 8 MG Caps capsule Commonly known as: RAPAFLO Stopped by: WClaretta Fraise MD       TAKE these medications    alfuzosin 10 MG 24 hr tablet Commonly known as: UROXATRAL Take 10 mg by mouth at bedtime.   cholecalciferol 25 MCG (1000 UNIT) tablet Commonly known as: VITAMIN D3 Take 1,000 Units by mouth daily.   multivitamin tablet Take 1 tablet by mouth daily.   Saw Palmetto 1000 MG Caps Take by mouth. Prostate pill prostate plus 1 tab per day         Follow-up: Return in about 1 year (around 05/21/2022).  WClaretta Fraise M.D.

## 2021-05-21 NOTE — Addendum Note (Signed)
Addended by: Baldomero Lamy B on: 05/21/2021 02:57 PM   Modules accepted: Orders

## 2021-05-22 LAB — CBC WITH DIFFERENTIAL/PLATELET
Basophils Absolute: 0 10*3/uL (ref 0.0–0.2)
Basos: 1 %
EOS (ABSOLUTE): 0.2 10*3/uL (ref 0.0–0.4)
Eos: 3 %
Hematocrit: 48.7 % (ref 37.5–51.0)
Hemoglobin: 15.8 g/dL (ref 13.0–17.7)
Immature Grans (Abs): 0.1 10*3/uL (ref 0.0–0.1)
Immature Granulocytes: 1 %
Lymphocytes Absolute: 2.4 10*3/uL (ref 0.7–3.1)
Lymphs: 37 %
MCH: 28.6 pg (ref 26.6–33.0)
MCHC: 32.4 g/dL (ref 31.5–35.7)
MCV: 88 fL (ref 79–97)
Monocytes Absolute: 0.4 10*3/uL (ref 0.1–0.9)
Monocytes: 7 %
Neutrophils Absolute: 3.5 10*3/uL (ref 1.4–7.0)
Neutrophils: 51 %
Platelets: 345 10*3/uL (ref 150–450)
RBC: 5.53 x10E6/uL (ref 4.14–5.80)
RDW: 13.1 % (ref 11.6–15.4)
WBC: 6.6 10*3/uL (ref 3.4–10.8)

## 2021-05-22 LAB — CMP14+EGFR
ALT: 43 IU/L (ref 0–44)
AST: 26 IU/L (ref 0–40)
Albumin/Globulin Ratio: 2.2 (ref 1.2–2.2)
Albumin: 5 g/dL — ABNORMAL HIGH (ref 3.8–4.9)
Alkaline Phosphatase: 91 IU/L (ref 44–121)
BUN/Creatinine Ratio: 14 (ref 10–24)
BUN: 14 mg/dL (ref 8–27)
Bilirubin Total: 0.2 mg/dL (ref 0.0–1.2)
CO2: 20 mmol/L (ref 20–29)
Calcium: 9.7 mg/dL (ref 8.6–10.2)
Chloride: 105 mmol/L (ref 96–106)
Creatinine, Ser: 0.97 mg/dL (ref 0.76–1.27)
Globulin, Total: 2.3 g/dL (ref 1.5–4.5)
Glucose: 83 mg/dL (ref 70–99)
Potassium: 4.5 mmol/L (ref 3.5–5.2)
Sodium: 142 mmol/L (ref 134–144)
Total Protein: 7.3 g/dL (ref 6.0–8.5)
eGFR: 89 mL/min/{1.73_m2} (ref >59)

## 2021-05-22 LAB — LIPID PANEL
Chol/HDL Ratio: 3.5 ratio (ref 0.0–5.0)
Cholesterol, Total: 188 mg/dL (ref 100–199)
HDL: 53 mg/dL (ref >39)
LDL Chol Calc (NIH): 110 mg/dL — ABNORMAL HIGH (ref 0–99)
Triglycerides: 140 mg/dL (ref 0–149)
VLDL Cholesterol Cal: 25 mg/dL (ref 5–40)

## 2021-05-22 LAB — VITAMIN D 25 HYDROXY (VIT D DEFICIENCY, FRACTURES): Vit D, 25-Hydroxy: 51.8 ng/mL (ref 30.0–100.0)

## 2021-05-22 NOTE — Progress Notes (Signed)
Hello Thomas Watson,  Your lab result is normal and/or stable.Some minor variations that are not significant are commonly marked abnormal, but do not represent any medical problem for you.  Best regards, [name], M.D.

## 2022-05-27 ENCOUNTER — Encounter: Payer: No Typology Code available for payment source | Admitting: Family Medicine

## 2022-08-29 ENCOUNTER — Ambulatory Visit (INDEPENDENT_AMBULATORY_CARE_PROVIDER_SITE_OTHER): Payer: 59 | Admitting: Family Medicine

## 2022-08-29 ENCOUNTER — Encounter: Payer: Self-pay | Admitting: Family Medicine

## 2022-08-29 VITALS — BP 129/74 | HR 79 | Temp 97.7°F | Ht 68.0 in | Wt 163.4 lb

## 2022-08-29 DIAGNOSIS — Z0000 Encounter for general adult medical examination without abnormal findings: Secondary | ICD-10-CM

## 2022-08-29 DIAGNOSIS — C61 Malignant neoplasm of prostate: Secondary | ICD-10-CM | POA: Diagnosis not present

## 2022-08-29 DIAGNOSIS — E559 Vitamin D deficiency, unspecified: Secondary | ICD-10-CM

## 2022-08-29 DIAGNOSIS — Z125 Encounter for screening for malignant neoplasm of prostate: Secondary | ICD-10-CM

## 2022-08-29 DIAGNOSIS — N400 Enlarged prostate without lower urinary tract symptoms: Secondary | ICD-10-CM | POA: Diagnosis not present

## 2022-08-29 DIAGNOSIS — Z1211 Encounter for screening for malignant neoplasm of colon: Secondary | ICD-10-CM

## 2022-08-29 DIAGNOSIS — Z0001 Encounter for general adult medical examination with abnormal findings: Secondary | ICD-10-CM

## 2022-08-29 DIAGNOSIS — Z87891 Personal history of nicotine dependence: Secondary | ICD-10-CM

## 2022-08-29 NOTE — Addendum Note (Signed)
Addended by: [name] on: 08/29/2022 02:53 PM   Modules accepted: Orders

## 2022-08-29 NOTE — Progress Notes (Addendum)
Subjective:  Patient ID: Thomas Watson, male    DOB: July 04, 1960  Age: 62 y.o. MRN: [id number]  CC: Annual Exam   HPI Rhylen Kimmes presents for Annual physical  No prescription meds other than using alfuzosin prn. Followed by urology for BPH and for Ca prostate. Had seed implants.   Smoker with 21 pack years based on 7 cigarettes a day for 30 years. Changed to vape 3 years ago. Colonoscopy is also due.      08/29/2022    1:49 PM 05/21/2021    2:18 PM 05/01/2020    2:13 PM  Depression screen PHQ 2/9  Decreased Interest 0 0 0  Down, Depressed, Hopeless 0 0 0  PHQ - 2 Score 0 0 0    History Zoren has a past medical history of Prostate cancer ([place]).   He has a past surgical history that includes Prostate biopsy; Radioactive seed implant (N/A, 12/06/2019); SPACE OAR INSTILLATION (N/A, 12/06/2019); and Lipoma excision (N/A, 05/15/2020).   His family history includes Bladder Cancer in his paternal grandmother; Colon cancer in his paternal uncle.He reports that he has been smoking cigarettes. He has a 7.50 pack-year smoking history. He has never used smokeless tobacco. He reports current alcohol use. He reports current drug use. Drug: Marijuana.    ROS Review of Systems  Constitutional:  Negative for activity change, fatigue and unexpected weight change.  HENT:  Negative for congestion, ear pain, hearing loss, postnasal drip and trouble swallowing.   Eyes:  Negative for pain and visual disturbance.  Respiratory:  Negative for cough, chest tightness and shortness of breath.   Cardiovascular:  Negative for chest pain, palpitations and leg swelling.  Gastrointestinal:  Negative for abdominal distention, abdominal pain, blood in stool, constipation, diarrhea, nausea and vomiting.  Endocrine: Negative for cold intolerance, heat intolerance and polydipsia.  Genitourinary:  Negative for difficulty urinating, dysuria, flank pain, frequency and urgency.  Musculoskeletal:  Negative for  arthralgias and joint swelling.  Skin:  Negative for color change, rash and wound.  Neurological:  Negative for dizziness, syncope, speech difficulty, weakness, light-headedness, numbness and headaches.  Hematological:  Does not bruise/bleed easily.  Psychiatric/Behavioral:  Negative for confusion, decreased concentration, dysphoric mood and sleep disturbance. The patient is not nervous/anxious.     Objective:  BP 129/74   Pulse 79   Temp 97.7 F (36.5 C)   Ht '5\' 8"'$  (1.727 m)   Wt 163 lb 6.4 oz (74.1 kg)   SpO2 98%   BMI 24.84 kg/m   BP Readings from Last 3 Encounters:  08/29/22 129/74  05/21/21 121/70  06/02/20 135/82    Wt Readings from Last 3 Encounters:  08/29/22 163 lb 6.4 oz (74.1 kg)  05/21/21 158 lb 9.6 oz (71.9 kg)  05/15/20 154 lb 1.6 oz (69.9 kg)     Physical Exam Constitutional:      Appearance: He is well-developed.  HENT:     Head: Normocephalic and atraumatic.  Eyes:     Pupils: Pupils are equal, round, and reactive to light.  Neck:     Thyroid: No thyromegaly.     Trachea: No tracheal deviation.  Cardiovascular:     Rate and Rhythm: Normal rate and regular rhythm.     Heart sounds: Normal heart sounds. No murmur heard.    No friction rub. No gallop.  Pulmonary:     Breath sounds: Normal breath sounds. No wheezing or rales.  Abdominal:     General: Bowel sounds are normal. There is  no distension.     Palpations: Abdomen is soft. There is no mass.     Tenderness: There is no abdominal tenderness.     Hernia: There is no hernia in the left inguinal area.  Genitourinary:    Penis: Normal.      Testes: Normal.  Musculoskeletal:        General: Normal range of motion.     Cervical back: Normal range of motion.  Lymphadenopathy:     Cervical: No cervical adenopathy.  Skin:    General: Skin is warm and dry.  Neurological:     Mental Status: He is alert and oriented to person, place, and time.       Assessment & Plan:   Arbaaz was seen  today for annual exam.  Diagnoses and all orders for this visit:  Physical exam, annual -     CBC with Differential/Platelet -     CMP14+EGFR -     Lipid panel -     VITAMIN D 25 Hydroxy (Vit-D Deficiency, Fractures) -     Urinalysis  Vitamin D deficiency -     VITAMIN D 25 Hydroxy (Vit-D Deficiency, Fractures)  Prostate cancer screening  Benign prostatic hyperplasia, unspecified whether lower urinary tract symptoms present  Malignant neoplasm of prostate ([place])  Screen for colon cancer -     Ambulatory referral to Gastroenterology  Stopped smoking with greater than 20 pack year history -     CT CHEST LUNG CANCER SCREENING LOW DOSE WO CONTRAST; Future       I am having Kirkpatrick Monclova maintain his multivitamin, Saw Palmetto, cholecalciferol, and alfuzosin.  Allergies as of 08/29/2022       Reactions   Ibuprofen Other (See Comments)   Intolerance. Causes GI upset.had stomach ulcers        Medication List        Accurate as of August 29, 2022  2:53 PM. If you have any questions, ask your nurse or doctor.          alfuzosin 10 MG 24 hr tablet Commonly known as: UROXATRAL Take 10 mg by mouth at bedtime.   cholecalciferol 25 MCG (1000 UNIT) tablet Commonly known as: VITAMIN D3 Take 1,000 Units by mouth daily.   multivitamin tablet Take 1 tablet by mouth daily.   Saw Palmetto 1000 MG Caps Take by mouth. Prostate pill prostate plus 1 tab per day         Follow-up: Return in about 1 year (around 08/29/2023).  [name], M.D.

## 2022-08-30 ENCOUNTER — Other Ambulatory Visit: Payer: 59

## 2022-08-30 LAB — URINALYSIS
Bilirubin, UA: NEGATIVE
Glucose, UA: NEGATIVE
Ketones, UA: NEGATIVE
Leukocytes,UA: NEGATIVE
Nitrite, UA: NEGATIVE
Protein,UA: NEGATIVE
Specific Gravity, UA: >1.03 — ABNORMAL HIGH (ref 1.005–1.030)
Urobilinogen, Ur: 0.2 mg/dL (ref 0.2–1.0)
pH, UA: 5.5 (ref 5.0–7.5)

## 2022-08-30 LAB — LIPID PANEL

## 2022-09-01 LAB — CBC WITH DIFFERENTIAL/PLATELET
Basophils Absolute: 0.1 10*3/uL (ref 0.0–0.2)
Basos: 1 %
EOS (ABSOLUTE): 0.3 10*3/uL (ref 0.0–0.4)
Eos: 4 %
Hematocrit: 49.6 % (ref 37.5–51.0)
Hemoglobin: 15.8 g/dL (ref 13.0–17.7)
Immature Grans (Abs): 0 10*3/uL (ref 0.0–0.1)
Immature Granulocytes: 0 %
Lymphocytes Absolute: 2.8 10*3/uL (ref 0.7–3.1)
Lymphs: 34 %
MCH: 28.4 pg (ref 26.6–33.0)
MCHC: 31.9 g/dL (ref 31.5–35.7)
MCV: 89 fL (ref 79–97)
Monocytes Absolute: 0.5 10*3/uL (ref 0.1–0.9)
Monocytes: 7 %
Neutrophils Absolute: 4.5 10*3/uL (ref 1.4–7.0)
Neutrophils: 54 %
Platelets: 349 10*3/uL (ref 150–450)
RBC: 5.56 x10E6/uL (ref 4.14–5.80)
RDW: 13.8 % (ref 11.6–15.4)
WBC: 8.2 10*3/uL (ref 3.4–10.8)

## 2022-09-01 LAB — CMP14+EGFR
ALT: 32 IU/L (ref 0–44)
AST: 24 IU/L (ref 0–40)
Albumin/Globulin Ratio: 1.8 (ref 1.2–2.2)
Albumin: 4.7 g/dL (ref 3.9–4.9)
Alkaline Phosphatase: 88 IU/L (ref 44–121)
BUN/Creatinine Ratio: 21 (ref 10–24)
BUN: 22 mg/dL (ref 8–27)
Bilirubin Total: 0.3 mg/dL (ref 0.0–1.2)
CO2: 14 mmol/L — ABNORMAL LOW (ref 20–29)
Calcium: 10 mg/dL (ref 8.6–10.2)
Chloride: 109 mmol/L — ABNORMAL HIGH (ref 96–106)
Creatinine, Ser: 1.05 mg/dL (ref 0.76–1.27)
Globulin, Total: 2.6 g/dL (ref 1.5–4.5)
Glucose: 102 mg/dL — ABNORMAL HIGH (ref 70–99)
Potassium: 5 mmol/L (ref 3.5–5.2)
Sodium: 145 mmol/L — ABNORMAL HIGH (ref 134–144)
Total Protein: 7.3 g/dL (ref 6.0–8.5)
eGFR: 81 mL/min/{1.73_m2} (ref >59)

## 2022-09-01 LAB — LIPID PANEL
Chol/HDL Ratio: 3.3 ratio (ref 0.0–5.0)
Cholesterol, Total: 194 mg/dL (ref 100–199)
HDL: 59 mg/dL (ref >39)
LDL Chol Calc (NIH): 122 mg/dL — ABNORMAL HIGH (ref 0–99)
Triglycerides: 68 mg/dL (ref 0–149)
VLDL Cholesterol Cal: 13 mg/dL (ref 5–40)

## 2022-09-01 LAB — VITAMIN D 25 HYDROXY (VIT D DEFICIENCY, FRACTURES): Vit D, 25-Hydroxy: 53.9 ng/mL (ref 30.0–100.0)

## 2022-11-18 ENCOUNTER — Telehealth: Payer: Self-pay | Admitting: Gastroenterology

## 2022-11-18 NOTE — Telephone Encounter (Signed)
Hi Dr. Tomasa Rand,   Supervising provider PM 05/20,  We received a referral for this patient for a colonoscopy. He has prior history with [facility]. He is wishing to transfer his care due to his providers being within the [facility] and [facility]. His records are in [facility] for you to review and advise on scheduling.   Thank you.

## 2022-11-20 NOTE — Telephone Encounter (Signed)
Called patient, spoke with wife. Advised I was calling to schedule. Patient will call back to schedule.

## 2023-09-01 ENCOUNTER — Encounter: Payer: 59 | Admitting: Family Medicine

## 2023-09-29 ENCOUNTER — Ambulatory Visit (INDEPENDENT_AMBULATORY_CARE_PROVIDER_SITE_OTHER): Payer: 59 | Admitting: Family Medicine

## 2023-09-29 ENCOUNTER — Encounter: Payer: Self-pay | Admitting: Family Medicine

## 2023-09-29 VITALS — BP 127/76 | HR 93 | Temp 98.3°F | Ht 68.0 in | Wt 156.0 lb

## 2023-09-29 DIAGNOSIS — N400 Enlarged prostate without lower urinary tract symptoms: Secondary | ICD-10-CM | POA: Diagnosis not present

## 2023-09-29 DIAGNOSIS — Z23 Encounter for immunization: Secondary | ICD-10-CM | POA: Diagnosis not present

## 2023-09-29 DIAGNOSIS — C61 Malignant neoplasm of prostate: Secondary | ICD-10-CM

## 2023-09-29 DIAGNOSIS — Z125 Encounter for screening for malignant neoplasm of prostate: Secondary | ICD-10-CM | POA: Diagnosis not present

## 2023-09-29 DIAGNOSIS — E559 Vitamin D deficiency, unspecified: Secondary | ICD-10-CM

## 2023-09-29 DIAGNOSIS — Z0001 Encounter for general adult medical examination with abnormal findings: Secondary | ICD-10-CM | POA: Diagnosis not present

## 2023-09-29 DIAGNOSIS — Z0000 Encounter for general adult medical examination without abnormal findings: Secondary | ICD-10-CM

## 2023-09-29 NOTE — Progress Notes (Addendum)
 Subjective:  Patient ID: Thomas Watson, male    DOB: Jul 15, 1960  Age: 63 y.o. MRN: [id number]  CC: Annual Exam (No concerns )   HPI Thomas Watson presents for CPE  Uroxatrol helps with urge to urinate. Doing much better so he has decreased to every other day dosing with  Urology suggesting. Followed with PSA for Ca Prostate, now below level detection. Had seed implant. Not bothered by erectile dysfunction.  Followed by [facility].      08/29/2022    1:49 PM 05/21/2021    2:18 PM 05/01/2020    2:13 PM  Depression screen PHQ 2/9  Decreased Interest 0 0 0  Down, Depressed, Hopeless 0 0 0  PHQ - 2 Score 0 0 0    History Thomas Watson has a past medical history of Prostate cancer ([facility]).   He has a past surgical history that includes Prostate biopsy; Radioactive seed implant (N/A, 12/06/2019); SPACE OAR INSTILLATION (N/A, 12/06/2019); and Lipoma excision (N/A, 05/15/2020).   His family history includes Bladder Cancer in his paternal grandmother; Colon cancer in his paternal uncle.He reports that he has been smoking cigarettes. He has a 7.5 pack-year smoking history. He has never used smokeless tobacco. He reports current alcohol use. He reports current drug use. Drug: Marijuana.    ROS Review of Systems  Constitutional:  Negative for activity change, fatigue and unexpected weight change.  HENT:  Negative for congestion, ear pain, hearing loss, postnasal drip and trouble swallowing.   Eyes:  Negative for pain and visual disturbance.  Respiratory:  Negative for cough, chest tightness and shortness of breath.   Cardiovascular:  Negative for chest pain, palpitations and leg swelling.  Gastrointestinal:  Negative for abdominal distention, abdominal pain, blood in stool, constipation, diarrhea, nausea and vomiting.  Endocrine: Negative for cold intolerance, heat intolerance and polydipsia.  Genitourinary:  Negative for difficulty urinating, dysuria, flank pain, frequency and urgency.   Musculoskeletal:  Negative for arthralgias and joint swelling.  Skin:  Negative for color change, rash and wound.  Neurological:  Negative for dizziness, syncope, speech difficulty, weakness, light-headedness, numbness and headaches.  Hematological:  Does not bruise/bleed easily.  Psychiatric/Behavioral:  Negative for confusion, decreased concentration, dysphoric mood and sleep disturbance. The patient is not nervous/anxious.     Objective:  BP 127/76   Pulse 93   Temp 98.3 F (36.8 C)   Ht 5' 8 (1.727 m)   Wt 156 lb (70.8 kg)   SpO2 98%   BMI 23.72 kg/m   BP Readings from Last 3 Encounters:  09/29/23 127/76  08/29/22 129/74  05/21/21 121/70    Wt Readings from Last 3 Encounters:  09/29/23 156 lb (70.8 kg)  08/29/22 163 lb 6.4 oz (74.1 kg)  05/21/21 158 lb 9.6 oz (71.9 kg)     Physical Exam Constitutional:      Appearance: He is well-developed.  HENT:     Head: Normocephalic and atraumatic.  Eyes:     Pupils: Pupils are equal, round, and reactive to light.  Neck:     Thyroid: No thyromegaly.     Trachea: No tracheal deviation.  Cardiovascular:     Rate and Rhythm: Normal rate and regular rhythm.     Heart sounds: Normal heart sounds. No murmur heard.    No friction rub. No gallop.  Pulmonary:     Breath sounds: Normal breath sounds. No wheezing or rales.  Abdominal:     General: Bowel sounds are normal. There is no distension.  Palpations: Abdomen is soft. There is no mass.     Tenderness: There is no abdominal tenderness.     Hernia: There is no hernia in the left inguinal area.  Genitourinary:    Penis: Normal.      Testes: Normal.  Musculoskeletal:        General: Normal range of motion.     Cervical back: Normal range of motion.  Lymphadenopathy:     Cervical: No cervical adenopathy.  Skin:    General: Skin is warm and dry.  Neurological:     Mental Status: He is alert and oriented to person, place, and time.      Assessment & Plan:   Physical exam, annual -     CBC with Differential/Platelet -     CMP14+EGFR -     Lipid panel -     VITAMIN D  25 Hydroxy (Vit-D Deficiency, Fractures) -     Urinalysis -     PSA, total and free  Vitamin D  deficiency -     VITAMIN D  25 Hydroxy (Vit-D Deficiency, Fractures)  Prostate cancer screening -     PSA, total and free  Benign prostatic hyperplasia, unspecified whether lower urinary tract symptoms present -     CBC with Differential/Platelet -     CMP14+EGFR -     PSA, total and free  Malignant neoplasm of prostate ([facility]) -     CBC with Differential/Platelet -     CMP14+EGFR -     PSA, total and free  Immunization due -     Pneumococcal conjugate vaccine 20-valent     Follow-up: No follow-ups on file.  Butler Der, M.D.

## 2023-10-03 ENCOUNTER — Other Ambulatory Visit

## 2023-10-03 LAB — URINALYSIS
Bilirubin, UA: NEGATIVE
Glucose, UA: NEGATIVE
Ketones, UA: NEGATIVE
Leukocytes,UA: NEGATIVE
Nitrite, UA: NEGATIVE
Protein,UA: NEGATIVE
RBC, UA: NEGATIVE
Specific Gravity, UA: 1.02 (ref 1.005–1.030)
Urobilinogen, Ur: 0.2 mg/dL (ref 0.2–1.0)
pH, UA: 6 (ref 5.0–7.5)

## 2023-10-04 LAB — CBC WITH DIFFERENTIAL/PLATELET
Basophils Absolute: 0.1 10*3/uL (ref 0.0–0.2)
Basos: 1 %
EOS (ABSOLUTE): 0.3 10*3/uL (ref 0.0–0.4)
Eos: 4 %
Hematocrit: 48 % (ref 37.5–51.0)
Hemoglobin: 15.5 g/dL (ref 13.0–17.7)
Immature Grans (Abs): 0 10*3/uL (ref 0.0–0.1)
Immature Granulocytes: 0 %
Lymphocytes Absolute: 2.2 10*3/uL (ref 0.7–3.1)
Lymphs: 31 %
MCH: 29.3 pg (ref 26.6–33.0)
MCHC: 32.3 g/dL (ref 31.5–35.7)
MCV: 91 fL (ref 79–97)
Monocytes Absolute: 0.6 10*3/uL (ref 0.1–0.9)
Monocytes: 8 %
Neutrophils Absolute: 4.1 10*3/uL (ref 1.4–7.0)
Neutrophils: 56 %
Platelets: 311 10*3/uL (ref 150–450)
RBC: 5.29 x10E6/uL (ref 4.14–5.80)
RDW: 13.6 % (ref 11.6–15.4)
WBC: 7.3 10*3/uL (ref 3.4–10.8)

## 2023-10-04 LAB — CMP14+EGFR
ALT: 37 IU/L (ref 0–44)
AST: 24 IU/L (ref 0–40)
Albumin: 4.3 g/dL (ref 3.9–4.9)
Alkaline Phosphatase: 80 IU/L (ref 44–121)
BUN/Creatinine Ratio: 20 (ref 10–24)
BUN: 20 mg/dL (ref 8–27)
Bilirubin Total: 0.3 mg/dL (ref 0.0–1.2)
CO2: 20 mmol/L (ref 20–29)
Calcium: 9.7 mg/dL (ref 8.6–10.2)
Chloride: 103 mmol/L (ref 96–106)
Creatinine, Ser: 1.02 mg/dL (ref 0.76–1.27)
Globulin, Total: 2.7 g/dL (ref 1.5–4.5)
Glucose: 94 mg/dL (ref 70–99)
Potassium: 4.3 mmol/L (ref 3.5–5.2)
Sodium: 140 mmol/L (ref 134–144)
Total Protein: 7 g/dL (ref 6.0–8.5)
eGFR: 83 mL/min/{1.73_m2} (ref >59)

## 2023-10-04 LAB — LIPID PANEL
Chol/HDL Ratio: 2.8 ratio (ref 0.0–5.0)
Cholesterol, Total: 176 mg/dL (ref 100–199)
HDL: 62 mg/dL (ref >39)
LDL Chol Calc (NIH): 102 mg/dL — ABNORMAL HIGH (ref 0–99)
Triglycerides: 63 mg/dL (ref 0–149)
VLDL Cholesterol Cal: 12 mg/dL (ref 5–40)

## 2023-10-04 LAB — PSA, TOTAL AND FREE
PSA, Free: <0.02 ng/mL
Prostate Specific Ag, Serum: <0.1 ng/mL (ref 0.0–4.0)

## 2023-10-04 LAB — VITAMIN D 25 HYDROXY (VIT D DEFICIENCY, FRACTURES): Vit D, 25-Hydroxy: 57.2 ng/mL (ref 30.0–100.0)

## 2023-10-06 ENCOUNTER — Encounter: Payer: Self-pay | Admitting: Family Medicine

## 2023-10-06 NOTE — Progress Notes (Signed)
Hello Thomas Watson,  Your lab result is normal and/or stable.Some minor variations that are not significant are commonly marked abnormal, but do not represent any medical problem for you.  Best regards, Virna Livengood, M.D.

## 2023-10-06 NOTE — Telephone Encounter (Signed)
 Copied from CRM [id number]. Topic: Clinical - Lab/Test Results >> Oct 06, 2023 12:22 PM Clayton Bibles wrote: Reason for CRM:  I read lab results from Dr. Darlyn Read. He understood everything and had no questions for a nurse.

## 2023-10-31 ENCOUNTER — Ambulatory Visit: Payer: Self-pay

## 2023-10-31 NOTE — Telephone Encounter (Signed)
   Chief Complaint: lab question  Disposition: [] ED /[] Urgent Care (no appt availability in office) / [] Appointment(In office/virtual)/ []  [facility]/ [] Home Care/ [] Refused Recommended Disposition /[] [facility]/ [x]  Follow-up with PCP Additional Notes: pt just wanted to know the numbers of his PSA.  NT answer pt questions.    Copied from CRM [id number]. Topic: Clinical - Lab/Test Results >> Oct 31, 2023  3:09 PM Carrielelia G wrote: Reason for CRM: Patient Greulich would like his PSA numbers. Reason for Disposition . [1] Other NON-URGENT information for PCP AND [2] does not require PCP response  Protocols used: PCP Call - No Triage-A-AH

## 2024-01-05 ENCOUNTER — Other Ambulatory Visit: Payer: Self-pay | Admitting: Family Medicine

## 2024-01-05 NOTE — Telephone Encounter (Unsigned)
 Copied from CRM [id number]. Topic: Clinical - Medication Refill >> Jan 05, 2024  2:02 PM Hamdi H wrote: Medication: alfuzosin  (UROXATRAL ) 10 MG 24 hr tablet   Has the patient contacted their pharmacy? Yes (Agent: If no, request that the patient contact the pharmacy for the refill. If patient does not wish to contact the pharmacy document the reason why and proceed with request.) (Agent: If yes, when and what did the pharmacy advise?) Couldn't refill at the pharmacy.  This is the patient's preferred pharmacy:  [id number] - [place], [place] - [address] [place] [address] Phone: [phone] Fax: [phone]  Is this the correct pharmacy for this prescription? Yes If no, delete pharmacy and type the correct one.   Has the prescription been filled recently? No  Is the patient out of the medication? No  Has the patient been seen for an appointment in the last year OR does the patient have an upcoming appointment? Yes  Can we respond through MyChart? No  Agent: Please be advised that Rx refills may take up to 3 business days. We ask that you follow-up with your pharmacy.

## 2024-01-06 MED ORDER — ALFUZOSIN HCL ER 10 MG PO TB24: 10.0000 mg | ORAL_TABLET | Freq: Every day | ORAL | 2 refills | Status: DC

## 2024-03-31 ENCOUNTER — Telehealth: Payer: Self-pay

## 2024-03-31 NOTE — Telephone Encounter (Signed)
 I called and spoke with patient to see why he was requesting lab order for PSA and Urine and he said he was diagnosed with Prostate Cancer about 4 years ago and is supposed to have lab work done every 6 months and its been 6 months since his last check.   Dr Zollie do you need to see patient for this or is it okay to just schedule lab appt?    Copied from CRM #[id number]. Topic: Clinical - Request for Lab/Test Order >> Mar 31, 2024 12:57 PM Delon T wrote: Reason for CRM: need order for PSA and urine test- [id number]

## 2024-04-06 ENCOUNTER — Other Ambulatory Visit: Payer: Self-pay | Admitting: Family Medicine

## 2024-04-06 DIAGNOSIS — C61 Malignant neoplasm of prostate: Secondary | ICD-10-CM

## 2024-04-06 NOTE — Telephone Encounter (Signed)
 I called and spoke with patient to see why he was requesting lab order for PSA and Urine and he said he was diagnosed with Prostate Cancer about 4 years ago and is supposed to have lab work done every 6 months and its been 6 months since his last check.    Dr Zollie do you need to see patient for this or is it okay to just schedule lab appt?

## 2024-04-06 NOTE — Telephone Encounter (Signed)
 I ordered the tests. He would only need to see me if he has any questions about the results

## 2024-04-06 NOTE — Telephone Encounter (Signed)
 I called and spoke with patient and made him aware. He voiced understanding and scheduled lab appt for this Friday.

## 2024-04-06 NOTE — Telephone Encounter (Signed)
 Pt called to check status of this request, pt wants to be scheduled as soon as possible. Please advise when orders are ready and if he needs to see his PCP as well   Best contact: [phone]

## 2024-04-07 ENCOUNTER — Other Ambulatory Visit: Payer: Self-pay | Admitting: Family Medicine

## 2024-04-09 ENCOUNTER — Other Ambulatory Visit

## 2024-04-09 DIAGNOSIS — C61 Malignant neoplasm of prostate: Secondary | ICD-10-CM

## 2024-04-09 LAB — URINALYSIS, COMPLETE
Bilirubin, UA: NEGATIVE
Glucose, UA: NEGATIVE
Leukocytes,UA: NEGATIVE
Nitrite, UA: NEGATIVE
Protein,UA: NEGATIVE
RBC, UA: NEGATIVE
Specific Gravity, UA: 1.025 (ref 1.005–1.030)
Urobilinogen, Ur: 0.2 mg/dL (ref 0.2–1.0)
pH, UA: 5.5 (ref 5.0–7.5)

## 2024-04-09 LAB — MICROSCOPIC EXAMINATION
Bacteria, UA: NONE SEEN
Epithelial Cells (non renal): NONE SEEN /HPF (ref 0–10)
RBC, Urine: NONE SEEN /HPF (ref 0–2)
Renal Epithel, UA: NONE SEEN /HPF
WBC, UA: NONE SEEN /HPF (ref 0–5)
Yeast, UA: NONE SEEN

## 2024-04-10 LAB — PSA, TOTAL AND FREE
PSA, Free: <0.02 ng/mL
Prostate Specific Ag, Serum: <0.1 ng/mL (ref 0.0–4.0)

## 2024-04-12 ENCOUNTER — Ambulatory Visit: Payer: Self-pay | Admitting: Family Medicine

## 2024-04-12 ENCOUNTER — Telehealth: Payer: Self-pay | Admitting: Family Medicine

## 2024-04-12 NOTE — Telephone Encounter (Signed)
 Will call patient back once labs have been reviewed by provider.

## 2024-04-12 NOTE — Telephone Encounter (Signed)
 Copied from CRM [id number]. Topic: Clinical - Lab/Test Results >> Apr 12, 2024  2:03 PM Joesph B wrote: Reason for CRM: patient is calling to get his results.

## 2024-04-12 NOTE — Progress Notes (Signed)
Hello Thomas Watson,  Your lab result is normal and/or stable.Some minor variations that are not significant are commonly marked abnormal, but do not represent any medical problem for you.  Best regards, Virna Livengood, M.D.

## 2024-04-13 ENCOUNTER — Ambulatory Visit: Payer: Self-pay

## 2024-04-13 NOTE — Telephone Encounter (Signed)
 FYI Only or Action Required?: FYI only for provider.  Patient was last seen in primary care on 09/29/2023 by Zollie Lowers, MD.  Called Nurse Triage reporting Advice Only.  Symptoms began information call only.  Interventions attempted: Other: information call only.  Symptoms are: information call only.  Triage Disposition: Information or Advice Only Call  Patient/caregiver understands and will follow disposition?: Yes                             Copied from CRM [id number]. Topic: Clinical - Lab/Test Results >> Apr 13, 2024 12:21 PM Larissa RAMAN wrote: Reason for CRM: patient has additional questions for lab results Reason for Disposition  Health information question, no triage required and triager able to answer question  Answer Assessment - Initial Assessment Questions 1. REASON FOR CALL: What is the main reason for your call? or How can I best help you?     Patient is calling in regarding lab results. Patient specifically wanted to know what his PSA level is. This RN reported <0.1, per lab results in chart. Patient has no additional questions at this time.  Protocols used: Information Only Call - No Triage-A-AH

## 2024-09-28 ENCOUNTER — Encounter: Payer: Self-pay | Admitting: Family Medicine
